# Patient Record
Sex: Male | Born: 1980 | Race: White | Hispanic: No | Marital: Married | Smoking: Never smoker
Health system: Southern US, Community
[De-identification: ages and names within clinical notes are randomized; demographics above are authoritative.]

## PROBLEM LIST (undated history)

## (undated) DIAGNOSIS — R269 Unspecified abnormalities of gait and mobility: Secondary | ICD-10-CM

## (undated) DIAGNOSIS — R569 Unspecified convulsions: Secondary | ICD-10-CM

## (undated) DIAGNOSIS — F209 Schizophrenia, unspecified: Secondary | ICD-10-CM

## (undated) DIAGNOSIS — G40909 Epilepsy, unspecified, not intractable, without status epilepticus: Secondary | ICD-10-CM

## (undated) HISTORY — DX: Unspecified abnormalities of gait and mobility: R26.9

---

## 2007-09-11 ENCOUNTER — Emergency Department: Payer: Self-pay | Admitting: Emergency Medicine

## 2007-09-22 ENCOUNTER — Emergency Department: Payer: Self-pay

## 2014-12-15 ENCOUNTER — Emergency Department: Payer: Self-pay | Admitting: Emergency Medicine

## 2015-06-20 ENCOUNTER — Telehealth: Payer: Self-pay | Admitting: Unknown Physician Specialty

## 2015-06-20 NOTE — Telephone Encounter (Signed)
I contacted billing, they stated the patient has Family Planning Medicaid only so Medicaid would not pay for the visit in question. I attempted to contact the patient's wife Lynnea Ferrier she did not answer and the name that came on the voicemail was not the patient's wife's name. Thanks.

## 2015-06-20 NOTE — Telephone Encounter (Signed)
Pt called concerning a bill he received from [place] regarding his office visit on 02/16/15. Stated they had received 3 bills that is now going to collections. Pt has Medicaid and stated he gave [place] his Medicaid card on the date of service. Patient requests call back to correct this, please call pt's wife Lynnea Ferrier @ [phone]. Thanks.

## 2016-02-09 ENCOUNTER — Encounter: Payer: Self-pay | Admitting: Emergency Medicine

## 2016-02-09 ENCOUNTER — Emergency Department
Admission: EM | Admit: 2016-02-09 | Discharge: 2016-02-09 | Disposition: A | Discharge status: Home / Self Care | Payer: Self-pay | Attending: Emergency Medicine | Admitting: Emergency Medicine

## 2016-02-09 DIAGNOSIS — G40909 Epilepsy, unspecified, not intractable, without status epilepticus: Secondary | ICD-10-CM

## 2016-02-09 DIAGNOSIS — F4325 Adjustment disorder with mixed disturbance of emotions and conduct: Secondary | ICD-10-CM

## 2016-02-09 DIAGNOSIS — F209 Schizophrenia, unspecified: Secondary | ICD-10-CM | POA: Insufficient documentation

## 2016-02-09 DIAGNOSIS — F4322 Adjustment disorder with anxiety: Secondary | ICD-10-CM | POA: Insufficient documentation

## 2016-02-09 HISTORY — DX: Epilepsy, unspecified, not intractable, without status epilepticus: G40.909

## 2016-02-09 HISTORY — DX: Schizophrenia, unspecified: F20.9

## 2016-02-09 LAB — CBC
HCT: 48.4 % (ref 40.0–52.0)
Hemoglobin: 16 g/dL (ref 13.0–18.0)
MCH: 28.6 pg (ref 26.0–34.0)
MCHC: 33.1 g/dL (ref 32.0–36.0)
MCV: 86.2 fL (ref 80.0–100.0)
PLATELETS: 203 10*3/uL (ref 150–440)
RBC: 5.61 MIL/uL (ref 4.40–5.90)
RDW: 13.4 % (ref 11.5–14.5)
WBC: 7.5 10*3/uL (ref 3.8–10.6)

## 2016-02-09 LAB — URINE DRUG SCREEN, QUALITATIVE (ARMC ONLY)
Amphetamines, Ur Screen: NOT DETECTED
BARBITURATES, UR SCREEN: NOT DETECTED
Benzodiazepine, Ur Scrn: NOT DETECTED
CANNABINOID 50 NG, UR ~~LOC~~: NOT DETECTED
COCAINE METABOLITE, UR ~~LOC~~: NOT DETECTED
MDMA (ECSTASY) UR SCREEN: NOT DETECTED
Methadone Scn, Ur: NOT DETECTED
Opiate, Ur Screen: NOT DETECTED
PHENCYCLIDINE (PCP) UR S: NOT DETECTED
TRICYCLIC, UR SCREEN: NOT DETECTED

## 2016-02-09 LAB — COMPREHENSIVE METABOLIC PANEL
ALK PHOS: 86 U/L (ref 38–126)
ALT: 40 U/L (ref 17–63)
AST: 39 U/L (ref 15–41)
Albumin: 5.1 g/dL — ABNORMAL HIGH (ref 3.5–5.0)
Anion gap: 11 (ref 5–15)
BILIRUBIN TOTAL: 1 mg/dL (ref 0.3–1.2)
BUN: 8 mg/dL (ref 6–20)
CALCIUM: 9.9 mg/dL (ref 8.9–10.3)
CHLORIDE: 105 mmol/L (ref 101–111)
CO2: 25 mmol/L (ref 22–32)
CREATININE: 0.95 mg/dL (ref 0.61–1.24)
Glucose, Bld: 103 mg/dL — ABNORMAL HIGH (ref 65–99)
Potassium: 3.6 mmol/L (ref 3.5–5.1)
Sodium: 141 mmol/L (ref 135–145)
Total Protein: 8.6 g/dL — ABNORMAL HIGH (ref 6.5–8.1)

## 2016-02-09 LAB — ETHANOL

## 2016-02-09 LAB — ACETAMINOPHEN LEVEL: Acetaminophen (Tylenol), Serum: <10 ug/mL — ABNORMAL LOW (ref 10–30)

## 2016-02-09 LAB — SALICYLATE LEVEL

## 2016-02-09 NOTE — ED Notes (Signed)
Pt presents to ED via [employer]. [employer] was called out when patient voiced thoughts of harming himself. Pt states that he thought of harming himself with a knife by stabbing himself in the chest. Pt is alert and oriented, states his wife left him this last night and he cried all night. Pt states he came willingly with the police which is who stopped him from stabbing himself in the chest. Pt denies any drinking/drug use at this time.

## 2016-02-09 NOTE — Discharge Instructions (Signed)
You have committed for safety not only of yourself but those around you. If you change your mind or you feel we are just a hurting herself or others you must return to the emergency department or seek help.

## 2016-02-09 NOTE — ED Notes (Signed)
Pt given back all belongings, states everything there he came in with. Pt awaiting for ride in waiting room at this time. Pt calm and cooperative, NAD distress noted.

## 2016-02-09 NOTE — Consult Note (Signed)
[facility] Face-to-Face Psychiatry Consult   Reason for Consult:  Consult for this 35 year old man came to the hospital under petition because he had made suicidal statements Referring Physician:  McShane Patient Identification: Timothy Potts MRN:  [id number] Principal Diagnosis: Adjustment disorder with mixed disturbance of emotions and conduct Diagnosis:   Patient Active Problem List   Diagnosis Date Noted  . Adjustment disorder with mixed disturbance of emotions and conduct [F43.25] 02/09/2016  . Epilepsy ([place]) [G18.299] 02/09/2016    Total Time spent with patient: 45 minutes  Subjective:   Timothy Potts is a 35 y.o. male patient admitted with "my wife must've had recently sent here"  HPI:  Patient says that he had called his wife last night after she left him and said that he was going to stab himself with a knife. He says that he made that statement specifically hoping that it would cause her to come back to him. Patient's wife left him last night saying that he was being immature and once a "break". He says that before this happened he was not feeling particularly depressed or down. Didn't have any particular sleep problems. He did quit his job at [employer] yesterday which may have been the last straw for his wife. Patient denies any hallucinations. He is not getting any psychiatric treatment. He did not actually do anything to hurt himself and says that he does not have any wish to do anything to hurt himself.  Medical history: Patient has epilepsy and takes Keppra. He sees a Garment/textile technologist at [facility]. No other medical problems.  Substance abuse history: Denies alcohol or drug abuse.  Social history: Patient is married. He and his wife been together about 14 months. Just the 2 of them at home. He worked in [employer]. He says he does have hobbies to kill time when he is at home.  Past Psychiatric History:  No previous psychiatric history no history of suicide attempts no history of  hospitalization. No reported history of major depression or psychotic symptoms.  Risk to Self: Is patient at risk for suicide?: Yes Risk to Others:   Prior Inpatient Therapy:   Prior Outpatient Therapy:    Past Medical History:  Past Medical History  Diagnosis Date  . Epilepsy ([place])   . Schizophrenia ([place])    History reviewed. No pertinent past surgical history. Family History: No family history on file. Family Psychiatric  History:  Patient does not know of any family history of mental health problems Social History:  History  Alcohol Use No     History  Drug Use No    Social History   Social History  . Marital Status: Married    Spouse Name: N/A  . Number of Children: N/A  . Years of Education: N/A   Social History Main Topics  . Smoking status: Never Smoker   . Smokeless tobacco: None  . Alcohol Use: No  . Drug Use: No  . Sexual Activity: Not Asked   Other Topics Concern  . None   Social History Narrative  . None   Additional Social History:    Allergies:  No Known Allergies  Labs:  Results for orders placed or performed during the hospital encounter of 02/09/16 (from the past 48 hour(s))  Comprehensive metabolic panel     Status: Abnormal   Collection Time: 02/09/16  4:25 PM  Result Value Ref Range   Sodium 141 135 - 145 mmol/L   Potassium 3.6 3.5 - 5.1 mmol/L   Chloride  105 101 - 111 mmol/L   CO2 25 22 - 32 mmol/L   Glucose, Bld 103 (H) 65 - 99 mg/dL   BUN 8 6 - 20 mg/dL   Creatinine, Ser 0.95 0.61 - 1.24 mg/dL   Calcium 9.9 8.9 - 10.3 mg/dL   Total Protein 8.6 (H) 6.5 - 8.1 g/dL   Albumin 5.1 (H) 3.5 - 5.0 g/dL   AST 39 15 - 41 U/L   ALT 40 17 - 63 U/L   Alkaline Phosphatase 86 38 - 126 U/L   Total Bilirubin 1.0 0.3 - 1.2 mg/dL   GFR calc non Af Amer >60 >60 mL/min   GFR calc Af Amer >60 >60 mL/min    Comment: (NOTE) The eGFR has been calculated using the CKD EPI equation. This calculation has not been validated in all clinical  situations. eGFR's persistently <60 mL/min signify possible Chronic Kidney Disease.    Anion gap 11 5 - 15  Ethanol (ETOH)     Status: None   Collection Time: 02/09/16  4:25 PM  Result Value Ref Range   Alcohol, Ethyl (B) <5 <5 mg/dL    Comment:        LOWEST DETECTABLE LIMIT FOR SERUM ALCOHOL IS 5 mg/dL FOR MEDICAL PURPOSES ONLY   Salicylate level     Status: None   Collection Time: 02/09/16  4:25 PM  Result Value Ref Range   Salicylate Lvl <7.3 2.8 - 30.0 mg/dL  Acetaminophen level     Status: Abnormal   Collection Time: 02/09/16  4:25 PM  Result Value Ref Range   Acetaminophen (Tylenol), Serum <10 (L) 10 - 30 ug/mL    Comment:        THERAPEUTIC CONCENTRATIONS VARY SIGNIFICANTLY. A RANGE OF 10-30 ug/mL MAY BE AN EFFECTIVE CONCENTRATION FOR MANY PATIENTS. HOWEVER, SOME ARE BEST TREATED AT CONCENTRATIONS OUTSIDE THIS RANGE. ACETAMINOPHEN CONCENTRATIONS >150 ug/mL AT 4 HOURS AFTER INGESTION AND >50 ug/mL AT 12 HOURS AFTER INGESTION ARE OFTEN ASSOCIATED WITH TOXIC REACTIONS.   CBC     Status: None   Collection Time: 02/09/16  4:25 PM  Result Value Ref Range   WBC 7.5 3.8 - 10.6 K/uL   RBC 5.61 4.40 - 5.90 MIL/uL   Hemoglobin 16.0 13.0 - 18.0 g/dL   HCT 48.4 40.0 - 52.0 %   MCV 86.2 80.0 - 100.0 fL   MCH 28.6 26.0 - 34.0 pg   MCHC 33.1 32.0 - 36.0 g/dL   RDW 13.4 11.5 - 14.5 %   Platelets 203 150 - 440 K/uL  Urine Drug Screen, Qualitative ([facility] only)     Status: None   Collection Time: 02/09/16  4:25 PM  Result Value Ref Range   Tricyclic, Ur Screen NONE DETECTED NONE DETECTED   Amphetamines, Ur Screen NONE DETECTED NONE DETECTED   MDMA (Ecstasy)Ur Screen NONE DETECTED NONE DETECTED   Cocaine Metabolite,Ur [place] NONE DETECTED NONE DETECTED   Opiate, Ur Screen NONE DETECTED NONE DETECTED   Phencyclidine (PCP) Ur S NONE DETECTED NONE DETECTED   Cannabinoid 50 Ng, Ur [place] NONE DETECTED NONE DETECTED   Barbiturates, Ur Screen NONE DETECTED NONE DETECTED    Benzodiazepine, Ur Scrn NONE DETECTED NONE DETECTED   Methadone Scn, Ur NONE DETECTED NONE DETECTED    Comment: (NOTE) 710  Tricyclics, urine               Cutoff 1000 ng/mL 200  Amphetamines, urine  Cutoff 1000 ng/mL 300  MDMA (Ecstasy), urine           Cutoff 500 ng/mL 400  Cocaine Metabolite, urine       Cutoff 300 ng/mL 500  Opiate, urine                   Cutoff 300 ng/mL 600  Phencyclidine (PCP), urine      Cutoff 25 ng/mL 700  Cannabinoid, urine              Cutoff 50 ng/mL 800  Barbiturates, urine             Cutoff 200 ng/mL 900  Benzodiazepine, urine           Cutoff 200 ng/mL 1000 Methadone, urine                Cutoff 300 ng/mL 1100 1200 The urine drug screen provides only a preliminary, unconfirmed 1300 analytical test result and should not be used for non-medical 1400 purposes. Clinical consideration and professional judgment should 1500 be applied to any positive drug screen result due to possible 1600 interfering substances. A more specific alternate chemical method 1700 must be used in order to obtain a confirmed analytical result.  1800 Gas chromato graphy / mass spectrometry (GC/MS) is the preferred 1900 confirmatory method.     No current facility-administered medications for this encounter.   No current outpatient prescriptions on file.    Musculoskeletal: Strength & Muscle Tone: within normal limits Gait & Station: normal Patient leans: N/A  Psychiatric Specialty Exam: Review of Systems  Constitutional: Negative.   HENT: Negative.   Eyes: Negative.   Respiratory: Negative.   Cardiovascular: Negative.   Gastrointestinal: Negative.   Musculoskeletal: Negative.   Skin: Negative.   Neurological: Negative.   Psychiatric/Behavioral: Negative for depression, suicidal ideas, hallucinations, memory loss and substance abuse. The patient is not nervous/anxious and does not have insomnia.     Blood pressure 143/91, pulse 100, temperature 97.9 F  (36.6 C), temperature source Oral, resp. rate 16, height 5' 5"  (1.651 m), weight 77.111 kg (170 lb), SpO2 97 %.Body mass index is 28.29 kg/(m^2).  General Appearance: Casual  Eye Contact::  Good  Speech:  Clear and Coherent  Volume:  Normal  Mood:  Euthymic  Affect:  Appropriate  Thought Process:  Logical  Orientation:  Full (Time, Place, and Person)  Thought Content:  Negative  Suicidal Thoughts:  No  Homicidal Thoughts:  No  Memory:  Immediate;   Good Recent;   Good Remote;   Good  Judgement:  Fair  Insight:  Fair  Psychomotor Activity:  Negative  Concentration:  Fair  Recall:  Ponder of Knowledge:Fair  Language: Fair  Akathisia:  No  Handed:  Right  AIMS (if indicated):     Assets:  Communication Skills Desire for Improvement Housing Resilience  ADL's:  Intact  Cognition: WNL  Sleep:      Treatment Plan Summary: Plan Patient is calm and appropriate. Not psychotic. Denies any suicidal ideation whatsoever. Patient is able to articulate an appropriate plan for self-care at home. Does not need hospitalization. Commitment discontinued. Supportive counseling done. Encouraged him to call mobile crisis if he again has suicidal thoughts or things are getting worse. Patient agrees to the plan. Case reviewed with emergency room doctor.  Disposition: Supportive therapy provided about ongoing stressors.  Alethia Berthold, MD 02/09/2016 6:01 PM

## 2016-02-09 NOTE — ED Notes (Addendum)
Pt remains in the hallway at this time. Pt alert and oriented, calm and cooperative at this time. Will continue to monitor with 15 min safety checks at this time.

## 2016-02-09 NOTE — BH Assessment (Signed)
Per [facility], patient does not meet inpatient criteria. He is to discharge home and follow-up with outpatient treatment.

## 2016-02-09 NOTE — ED Provider Notes (Addendum)
[facility] Emergency Department Provider Note  ____________________________________________   I have reviewed the triage vital signs and the nursing notes.   HISTORY  Chief Complaint Suicidal    HPI Timothy Potts is a 35 y.o. male seen here by psychiatry. Patient apparently had thoughts of harming himself  today. He states his wife left him and he was very upset. He does not drink alcohol. He was seen in via by psychiatry who feels that he is not in any imminent danger to himself. Patient himself contract for safety at this time. No other complaints.States that he was cutting food with a knife and he had "thoughts of doing something else with it to myself". Those thoughts have past.  Past Medical History  Diagnosis Date  . Epilepsy ([facility])   . Schizophrenia [facility])     Patient Active Problem List   Diagnosis Date Noted  . 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 Epilepsy ([facility]) 02/09/2016    History reviewed. No pertinent past surgical history.  No current outpatient prescriptions on file.  Allergies Review of patient's allergies indicates no known allergies.  No family history on file.  Social History Social History  Substance Use Topics  . Smoking status: Never Smoker   . Smokeless tobacco: None  . Alcohol Use: No    Review of Systems Constitutional: No fever/chills Eyes: No visual changes. ENT: No sore throat. No stiff neck no neck pain Cardiovascular: Denies chest pain. Respiratory: Denies shortness of breath. Gastrointestinal:   no vomiting.  No diarrhea.  No constipation. Genitourinary: Negative for dysuria. Musculoskeletal: Negative lower extremity swelling Skin: Negative for rash. Neurological: Negative for headaches, focal weakness or numbness. 10-point ROS otherwise negative.  ____________________________________________   PHYSICAL EXAM:  VITAL SIGNS: ED Triage Vitals  Enc Vitals  Group     BP 02/09/16 1622 143/91 mmHg     Pulse Rate 02/09/16 1622 100     Resp 02/09/16 1622 16     Temp 02/09/16 1622 97.9 F (36.6 C)     Temp Source 02/09/16 1622 Oral     SpO2 02/09/16 1622 97 %     Weight 02/09/16 1623 170 lb (77.111 kg)     Height 02/09/16 1623  (1.651 m)     Head Cir --      Peak Flow --      Pain Score 02/09/16 1623 0     Pain Loc --      Pain Edu? --      Excl. in GC? --     Constitutional: Alert and oriented. Well appearing and in no acute distress. Eyes: Conjunctivae are normal. PERRL. EOMI. Head: Atraumatic. Nose: No congestion/rhinnorhea. Mouth/Throat: Mucous membranes are moist.  Oropharynx non-erythematous. Neck: No stridor.   Nontender with no meningismus Cardiovascular: Normal rate, regular rhythm. Grossly normal heart sounds.  Good peripheral circulation. Respiratory: Normal respiratory effort.  No retractions. Lungs CTAB. Abdominal: Soft and nontender. No distention. No guarding no rebound Back:  There is no focal tenderness or step off there is no midline tenderness there are no lesions noted. there is no CVA tenderness Musculoskeletal: No lower extremity tenderness. No joint effusions, no DVT signs strong distal pulses no edema Neurologic:  Normal speech and language. No gross focal neurologic deficits are appreciated.  Skin:  Skin is warm, dry and intact. No rash noted. Psychiatric: Mood and affect are normal. Speech and behavior are normal.  ____________________________________________   LABS (all labs  ordered are listed, but only abnormal results are displayed)  Labs Reviewed  COMPREHENSIVE METABOLIC PANEL - Abnormal; Notable for the following:    Glucose, Bld 103 (*)    Total Protein 8.6 (*)    Albumin 5.1 (*)    All other components within normal limits  ACETAMINOPHEN LEVEL - Abnormal; Notable for the following:    Acetaminophen (Tylenol), Serum <10 (*)    All other components within normal limits  ETHANOL  SALICYLATE  LEVEL  CBC  URINE DRUG SCREEN, QUALITATIVE ([facility] ONLY)   ____________________________________________  EKG  I personally interpreted any EKGs ordered by me or triage  ____________________________________________  RADIOLOGY  I reviewed any imaging ordered by me or triage that were performed during my shift ____________________________________________   PROCEDURES  Procedure(s) performed: None  Critical Care performed: None  ____________________________________________   INITIAL IMPRESSION / ASSESSMENT AND PLAN / ED COURSE  Pertinent labs & imaging results that were available during my care of the patient were reviewed by me and considered in my medical decision making (see chart for details).  Patient has a history of schizophrenia and was thinking about hurting himself. He no longer is any psychotic , and he has no thoughts of hurting himself at this time. Seen and evaluated by his psychiatrist here in the emergency department he feels the patient should not be under IVC and should go home.  ----------------------------------------- 6:19 PM on 02/09/2016 -----------------------------------------  As of discussed with the patient again. He makes direct eye contact and contracts for safety has absolutely no intention of hurting himself or anyone else he states he was just overwrought but that is gone now. He has support from pastor at his church as well as his family. He is adamant that he has no urge to hurt anyone including himself or his estranged wife. He has no history of violence he states. Again he was evaluated by our psychiatrist who do not feel that there is grounds for IVC. ____________________________________________   FINAL CLINICAL IMPRESSION(S) / ED DIAGNOSES  Final diagnoses:  None      This chart was dictated using voice recognition software.  Despite best efforts to proofread,  errors can occur which can change meaning.     Jeanmarie Plant,  MD 02/09/16 1610  Jeanmarie Plant, MD 02/09/16 1820  Jeanmarie Plant, MD 02/09/16 [phone]

## 2016-02-09 NOTE — ED Notes (Signed)
Pt reports attempting to commit suicide, reports he was going to stab himself in his heart. Pt denies any HI. Pt here voluntarily.

## 2016-05-09 ENCOUNTER — Ambulatory Visit
Admission: EM | Admit: 2016-05-09 | Discharge: 2016-05-09 | Discharge status: Against Medical Advice | Payer: Self-pay | Attending: Family Medicine | Admitting: Family Medicine

## 2016-05-09 ENCOUNTER — Encounter: Payer: Self-pay | Admitting: *Deleted

## 2016-05-09 DIAGNOSIS — R569 Unspecified convulsions: Secondary | ICD-10-CM

## 2016-05-09 HISTORY — DX: Unspecified convulsions: R56.9

## 2016-05-09 LAB — GLUCOSE, CAPILLARY: Glucose-Capillary: 88 mg/dL (ref 65–99)

## 2016-05-09 NOTE — Discharge Instructions (Signed)
Epilepsy °Epilepsy is a disorder in which a person has repeated seizures over time. A seizure is a release of abnormal electrical activity in the brain. Seizures can cause a change in attention, behavior, or the ability to remain awake and alert (altered mental status). Seizures often involve uncontrollable shaking (convulsions).  °Most people with epilepsy lead normal lives. However, people with epilepsy are at an increased risk of falls, accidents, and injuries. Therefore, it is important to begin treatment right away. °CAUSES  °Epilepsy has many possible causes. Anything that disturbs the normal pattern of brain cell activity can lead to seizures. This may include:  °· Head injury. °· Birth trauma. °· High fever as a child. °· Stroke. °· Bleeding into or around the brain. °· Certain drugs. °· Prolonged low oxygen, such as what occurs after CPR efforts. °· Abnormal brain development. °· Certain illnesses, such as meningitis, encephalitis (brain infection), malaria, and other infections. °· An imbalance of nerve signaling chemicals (neurotransmitters).   °SIGNS AND SYMPTOMS  °The symptoms of a seizure can vary greatly from one person to another. Right before a seizure, you may have a warning (aura) that a seizure is about to occur. An aura may include the following symptoms: °· Fear or anxiety. °· Nausea. °· Feeling like the room is spinning (vertigo). °· Vision changes, such as seeing flashing lights or spots. °Common symptoms during a seizure include: °· Abnormal sensations, such as an abnormal smell or a bitter taste in the mouth.   °· Sudden, general body stiffness.   °· Convulsions that involve rhythmic jerking of the face, arm, or leg on one or both sides.   °· Sudden change in consciousness.   °¨ Appearing to be awake but not responding.   °¨ Appearing to be asleep but cannot be awakened.   °· Grimacing, chewing, lip smacking, drooling, tongue biting, or loss of bowel or bladder control. °After a seizure,  you may feel sleepy for a while.  °DIAGNOSIS  °Your health care provider will ask about your symptoms and take a medical history. Descriptions from any witnesses to your seizures will be very helpful in the diagnosis. A physical exam, including a detailed neurological exam, is necessary. Various tests may be done, such as:  °· An electroencephalogram (EEG). This is a painless test of your brain waves. In this test, a diagram is created of your brain waves. These diagrams can be interpreted by a specialist. °· An MRI of the brain.   °· A CT scan of the brain.   °· A spinal tap (lumbar puncture, LP). °· Blood tests to check for signs of infection or abnormal blood chemistry. °TREATMENT  °There is no cure for epilepsy, but it is generally treatable. Once epilepsy is diagnosed, it is important to begin treatment as soon as possible. For most people with epilepsy, seizures can be controlled with medicines. The following may also be used: °· A pacemaker for the brain (vagus nerve stimulator) can be used for people with seizures that are not well controlled by medicine. °· Surgery on the brain. °For some people, epilepsy eventually goes away. °HOME CARE INSTRUCTIONS  °· Follow your health care provider's recommendations on driving and safety in normal activities. °· Get enough rest. Lack of sleep can cause seizures. °· Only take over-the-counter or prescription medicines as directed by your health care provider. Take any prescribed medicine exactly as directed. °· Avoid any known triggers of your seizures. °· Keep a seizure diary. Record what you recall about any seizure, especially any possible trigger.   °· Make   sure the people you live and work with know that you are prone to seizures. They should receive instructions on how to help you. In general, a witness to a seizure should:   °¨ Cushion your head and body.   °¨ Turn you on your side.   °¨ Avoid unnecessarily restraining you.   °¨ Not place anything inside your  mouth.   °¨ Call for emergency medical help if there is any question about what has occurred.   °· Follow up with your health care provider as directed. You may need regular blood tests to monitor the levels of your medicine.   °SEEK MEDICAL CARE IF:  °· You develop signs of infection or other illness. This might increase the risk of a seizure.   °· You seem to be having more frequent seizures.   °· Your seizure pattern is changing.   °SEEK IMMEDIATE MEDICAL CARE IF:  °· You have a seizure that does not stop after a few moments.   °· You have a seizure that causes any difficulty in breathing.   °· You have a seizure that results in a very severe headache.   °· You have a seizure that leaves you with the inability to speak or use a part of your body.   °  °This information is not intended to replace advice given to you by your health care provider. Make sure you discuss any questions you have with your health care provider. °  °Document Released: 12/17/2005 Document Revised: 10/07/2013 Document Reviewed: 07/29/2013 °Elsevier Interactive Patient Education ©2016 [employer] ° ° °Seizure, Adult °A seizure is abnormal electrical activity in the brain. Seizures usually last from 30 seconds to 2 minutes. There are various types of seizures. °Before a seizure, you may have a warning sensation (aura) that a seizure is about to occur. An aura may include the following symptoms:  °· Fear or anxiety. °· Nausea. °· Feeling like the room is spinning (vertigo). °· Vision changes, such as seeing flashing lights or spots. °Common symptoms during a seizure include: °· A change in attention or behavior (altered mental status). °· Convulsions with rhythmic jerking movements. °· Drooling. °· Rapid eye movements. °· Grunting. °· Loss of bladder and bowel control. °· Bitter taste in the mouth. °· Tongue biting. °After a seizure, you may feel confused and sleepy. You may also have an injury resulting from convulsions during the  seizure. °HOME CARE INSTRUCTIONS  °· If you are given medicines, take them exactly as prescribed by your health care provider. °· Keep all follow-up appointments as directed by your health care provider. °· Do not swim or drive or engage in risky activity during which a seizure could cause further injury to you or others until your health care provider says it is OK. °· Get adequate rest. °· Teach friends and family what to do if you have a seizure. They should: °¨ Lay you on the ground to prevent a fall. °¨ Put a cushion under your head. °¨ Loosen any tight clothing around your neck. °¨ Turn you on your side. If vomiting occurs, this helps keep your airway clear. °¨ Stay with you until you recover. °¨ Know whether or not you need emergency care. °SEEK IMMEDIATE MEDICAL CARE IF: °· The seizure lasts longer than 5 minutes. °· The seizure is severe or you do not wake up immediately after the seizure. °· You have an altered mental status after the seizure. °· You are having more frequent or worsening seizures. °Someone should drive you to the emergency department or call local emergency   services (911 in [place] °MAKE SURE YOU: °· Understand these instructions. °· Will watch your condition. °· Will get help right away if you are not doing well or get worse. °  °This information is not intended to replace advice given to you by your health care provider. Make sure you discuss any questions you have with your health care provider. °  °Document Released: 12/14/2000 Document Revised: 01/07/2015 Document Reviewed: 07/29/2013 °Elsevier Interactive Patient Education ©2016 Elsevier Inc. ° °

## 2016-05-09 NOTE — ED Provider Notes (Signed)
CSN: [id number]     Arrival date & time 05/09/16  1620 History   First MD Initiated Contact with Patient 05/09/16 1643     Chief Complaint  Patient presents with  . Seizures   (Consider location/radiation/quality/duration/timing/severity/associated sxs/prior Treatment) HPI Comments: Married caucasian male with known history epilepsy, schizophrenia had seizure [place] [address] with daughter driving approximately 10 minutes per arrival.  LOC less than 1 minute per daughter.  Spouse met patient at urgent care left work.  Daughter reported patient was snoring/gasping at end of seizure.  Spouse reported patient has been having staring spells over the past month approximately 4 episodes she is aware of.  Patient reported he has only been taking 1072m of Keppra BID versus prescribed 15062mpo BID due to cost of medication.  [facility] and only has 4 pills left (50048m  Spouse and patient had not contacted neurologist or PCM regarding staring/abscense spells.  Patient reported he bit tongue during seizure and currently sore and is feeling hot.  Patient denied recent illness.  Works at [employer] all positions as needed.  Needs work excuse for today and would like me to call his manager to verify he was at urgent care s/p seizure today.  Daughter reported patient was confused after waking up from seizure in car--[place] [facility]  Patient reported he has forehead headache--typical after a seizure for him.  Denied vomiting.  Not taking lamictal at all.  [facility].  Denied recent illness/trauma/illicit drug use.  The history is provided by the patient, the spouse and a relative.    Past Medical History  Diagnosis Date  . [place] ([place] . [place]ia ([place] . [place] ([place]  History reviewed. No pertinent past  surgical history. History reviewed. No pertinent family history. Social History  Substance Use Topics  . Smoking status: Never Smoker   . Smokeless tobacco: None  . Alcohol Use: No    Review of Systems  Constitutional: Positive for diaphoresis. Negative for fever, chills, activity change, appetite change, fatigue and unexpected weight change.  HENT: Negative for congestion, dental problem, drooling, ear discharge, ear pain, facial swelling, hearing loss, mouth sores, nosebleeds, postnasal drip, rhinorrhea, sinus pressure, sneezing, sore throat, tinnitus, trouble swallowing and voice change.   Eyes: Negative for photophobia, pain, discharge, redness, itching and visual disturbance.  Respiratory: Negative for cough, choking, chest tightness, shortness of breath, wheezing and stridor.   Cardiovascular: Negative for chest pain, palpitations and leg swelling.  Gastrointestinal: Negative for nausea, vomiting, abdominal pain, diarrhea, constipation, blood in stool and abdominal distention.  Endocrine: Negative for cold intolerance and heat intolerance.  Genitourinary: Negative for dysuria and difficulty urinating.  Musculoskeletal: Negative for myalgias, back pain, joint swelling, arthralgias, gait problem, neck pain and neck stiffness.  Skin: Negative for color change, pallor, rash and wound.  Allergic/Immunologic: Positive for environmental allergies. Negative for food allergies and immunocompromised state.  Neurological: Positive for seizures and headaches. Negative for dizziness, tremors, syncope, facial asymmetry, speech difficulty, weakness, light-headedness and numbness.  Hematological: Negative for adenopathy. Does not bruise/bleed easily.  Psychiatric/Behavioral: Negative for suicidal ideas, hallucinations, behavioral problems, confusion, sleep disturbance, self-injury and agitation. The patient is not nervous/anxious and is not hyperactive.     Allergies  Review of patient's allergies  indicates no known  allergies.  Home Medications   Prior to Admission medications   Medication Sig Start Date End Date Taking? Authorizing Provider  lamoTRIgine (LAMICTAL) 100 MG tablet Take 100 mg by mouth daily.   Yes Historical Provider, MD  levETIRAcetam (KEPPRA) 500 MG tablet Take 500 mg by mouth 2 (two) times daily.   Yes Historical Provider, MD   Meds Ordered and Administered this Visit  Medications - No data to display  Pulse 134  Temp(Src) 98.1 F (36.7 C) (Oral)  Resp 20  Ht 5' 5"  (1.651 m)  Wt 179 lb (81.194 kg)  BMI 29.79 kg/m2  SpO2 97% No data found.   Physical Exam  Constitutional: He is oriented to person, place, and time. Vital signs are normal. He appears well-developed and well-nourished. He is active and cooperative.  Non-toxic appearance. He does not have a sickly appearance. He does not appear ill. No distress.  HENT:  Head: Normocephalic and atraumatic.  Right Ear: Hearing, external ear and ear canal normal. A middle ear effusion is present.  Left Ear: Hearing, external ear and ear canal normal. A middle ear effusion is present.  Nose: No mucosal edema, rhinorrhea, nose lacerations, sinus tenderness, nasal deformity, septal deviation or nasal septal hematoma. No epistaxis.  No foreign bodies. Right sinus exhibits no maxillary sinus tenderness and no frontal sinus tenderness. Left sinus exhibits no maxillary sinus tenderness and no frontal sinus tenderness.  Mouth/Throat: Uvula is midline and mucous membranes are normal. Mucous membranes are not pale, not dry and not cyanotic. He does not have dentures. No oral lesions. No trismus in the jaw. Normal dentition. No dental abscesses, uvula swelling, lacerations or dental caries. Posterior oropharyngeal edema and posterior oropharyngeal erythema present. No oropharyngeal exudate or tonsillar abscesses.    Cobblestoning posterior pharynx; bilateral TMs with air fluid level clear; teeth marks/lateral bruising  bilaterally tongue anterior no bleeding noted  Eyes: Conjunctivae, EOM and lids are normal. Pupils are equal, round, and reactive to light. Right eye exhibits no chemosis, no discharge, no exudate and no hordeolum. No foreign body present in the right eye. Left eye exhibits no chemosis, no discharge, no exudate and no hordeolum. No foreign body present in the left eye. Right conjunctiva is not injected. Right conjunctiva has no hemorrhage. Left conjunctiva is not injected. Left conjunctiva has no hemorrhage. No scleral icterus. Right eye exhibits normal extraocular motion and no nystagmus. Left eye exhibits normal extraocular motion and no nystagmus. Right pupil is round and reactive. Left pupil is round and reactive. Pupils are equal.  Wearing glasses  Neck: Trachea normal and normal range of motion. Neck supple. No tracheal tenderness, no spinous process tenderness and no muscular tenderness present. No rigidity. No tracheal deviation, no edema, no erythema and normal range of motion present. No thyroid mass and no thyromegaly present.  Cardiovascular: Normal rate, regular rhythm, S1 normal, S2 normal, normal heart sounds and intact distal pulses.  PMI is not displaced.  Exam reveals no gallop and no friction rub.   No murmur heard. Pulses:      Radial pulses are 2+ on the right side, and 2+ on the left side.  Pulmonary/Chest: Effort normal and breath sounds normal. No accessory muscle usage or stridor. No respiratory distress. He has no decreased breath sounds. He has no wheezes. He has no rhonchi. He has no rales.  Abdominal: Soft. Normal appearance and bowel sounds are normal. He exhibits no distension. There is no tenderness.  Musculoskeletal: Normal range of motion. He exhibits  no edema or tenderness.       Right shoulder: Normal.       Left shoulder: Normal.       Right elbow: Normal.      Left elbow: Normal.       Right hip: Normal.       Left hip: Normal.       Right knee: Normal.        Left knee: Normal.       Right ankle: Normal.       Left ankle: Normal.       Cervical back: Normal.       Right hand: Normal.       Left hand: Normal.  Lymphadenopathy:       Head (right side): No submental, no submandibular, no tonsillar, no preauricular, no posterior auricular and no occipital adenopathy present.       Head (left side): No submental, no submandibular, no tonsillar, no preauricular, no posterior auricular and no occipital adenopathy present.    He has no cervical adenopathy.       Right cervical: No superficial cervical, no deep cervical and no posterior cervical adenopathy present.      Left cervical: No superficial cervical, no deep cervical and no posterior cervical adenopathy present.  Neurological: He is alert and oriented to person, place, and time. He is not disoriented. He displays no atrophy, no tremor and normal reflexes. No cranial nerve deficit or sensory deficit. He exhibits normal muscle tone. He displays no seizure activity. Coordination and gait normal. GCS eye subscore is 4. GCS verbal subscore is 5. GCS motor subscore is 6.  Reflex Scores:      Patellar reflexes are 3+ on the right side and 3+ on the left side. Patient in wheelchair from car/parking lot to exam room; answered all questions appropriately A&Ox3 speech clear strength equal bilaterally 5/5 extremities gross neuro exam WNL  Skin: Skin is warm, dry and intact. No abrasion, no bruising, no burn, no ecchymosis, no laceration, no lesion, no petechiae and no rash noted. He is not diaphoretic. No cyanosis or erythema. No pallor. Nails show no clubbing.  Psychiatric: He has a normal mood and affect. His speech is normal and behavior is normal. Judgment and thought content normal. Cognition and memory are normal.  Nursing note and vitals reviewed.   ED Course  Procedures (including critical care time)  Labs Review Labs Reviewed  GLUCOSE, CAPILLARY  CBG MONITORING, ED    Imaging Review No results  found.  AMA refused EMS transport to ER or go to ER via POV Discussed risks and benefits at 1715.  Fingerstick glucose normal, VSS gross neuro check WNL at 1715  BP 110/76 HR 114 Daughter and spouse in exam room with patient.  Daughter discussed with mother and patient she preferred if patient via ambulance to ER.  Patient refused and spouse agreed.  Had patient call neurologist and [facility] pharmacy left voicemail for follow up tomorrow on re-evaluations and his medications.  Discussed with all if any seizures tonight recommended calling EMS and patient going to hospital as our facility unable to check any drug levels tonight ship out only.  We do not carry IV Keppra or po seizure medications.  Recommended keppra drug level and drug screen along with glucose fingerstick today.  Patient only allowed glucose fingerstick 88 performed by RN Myles Lipps.  Patient tolerated two glasses of ice water and took tylenol 1044m po from daughter OTC bottle acetaminophen for headache  that started after seizure "his usual".  Case discussed with Dr Posey Pronto recommended EMS transfer to hospital for further evaluation and monitoring.  Patient and family refused transfer and requested discharge to home instead.  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  Chris Pharmacist, community at [employer] [facility] [address] contacted via telephone, notified patient s/p seizure and did not recommend patient working until cleared by PCM/neurology.  Chris Oncologist) verbalized understanding of information and had no further questions at this time  Patient tolerated two glasses of water without difficulty, speech clear, A&Ox3 on discharge AMA.  Discussed patient to take neurologist prescribed pm dose of Keppra 1518m and that this is  the highest recommended dose BID.  Discussed even with taking this dose patient medication level could be still sub-therapeutic and further seizures possible. Avoid driving, Avoid alcohol, avoid street drugs or other medications unless discussed with his doctors (PCM/Neurology) Reiterated with family members and patient if further seizures recommended patient go via EMS to hospital of choice for re-evaluation.  Patient and family verbalized understanding of information/instructions and had no further questions at this time.  1[place]paperwork completed with patient and family see scan in chart by RN SMyles Lipps   MDM   1. Seizure ([place]    Noncompliant with keppra 15040mpo BID dosing due to cost of medications per patient and spouse.  [facility] and has not followed up with pharmacy POC or discussed running low on medications with PCM/Neurology to see if further drug assistance available.  Discussed patient to take neurologist prescribed pm dose of Keppra 150029mpon arrival home tonight and that this is the highest recommended dose BID per [facility].  Discussed even with taking this dose patient medication level could be still sub-therapeutic and further seizures possible. Avoid driving, Avoid alcohol, avoid street drugs or other medications unless discussed with his doctors (PCM/Neurology) Reiterated with family members and patient if further seizures tonight recommended patient go via EMS to hospital of choice for re-evaluation as he may need IV medications which we do not carry at urgent care.  Discussed in the future recommended he go to medical center [facility] as neurology on call provider on site/IV medications available.  Discussed risks of utilizing POV no medical equipment/staff to perform CPR/keep airway open/administer oxygen if needed/start IV/give medications.  Patient and family verbalized understanding  of information/instructions and had no further questions at this time.     TinOlen CordialP 05/09/16 2236
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
## 2017-08-28 ENCOUNTER — Inpatient Hospital Stay (HOSPITAL_COMMUNITY): Payer: Medicaid Other

## 2017-08-28 ENCOUNTER — Emergency Department (HOSPITAL_COMMUNITY): Payer: Medicaid Other

## 2017-08-28 ENCOUNTER — Inpatient Hospital Stay (HOSPITAL_COMMUNITY)
Admission: EM | Admit: 2017-08-28 | Discharge: 2017-09-26 | DRG: 082 | Disposition: A | Discharge status: Rehabilitation Facility | Payer: Medicaid Other | Attending: General Surgery | Admitting: General Surgery

## 2017-08-28 ENCOUNTER — Other Ambulatory Visit: Payer: Self-pay

## 2017-08-28 ENCOUNTER — Encounter (HOSPITAL_COMMUNITY): Payer: Self-pay

## 2017-08-28 DIAGNOSIS — B965 Pseudomonas (aeruginosa) (mallei) (pseudomallei) as the cause of diseases classified elsewhere: Secondary | ICD-10-CM | POA: Diagnosis not present

## 2017-08-28 DIAGNOSIS — S0240CA Maxillary fracture, right side, initial encounter for closed fracture: Secondary | ICD-10-CM | POA: Diagnosis present

## 2017-08-28 DIAGNOSIS — Y95 Nosocomial condition: Secondary | ICD-10-CM | POA: Diagnosis not present

## 2017-08-28 DIAGNOSIS — S2241XA Multiple fractures of ribs, right side, initial encounter for closed fracture: Secondary | ICD-10-CM

## 2017-08-28 DIAGNOSIS — E119 Type 2 diabetes mellitus without complications: Secondary | ICD-10-CM | POA: Diagnosis present

## 2017-08-28 DIAGNOSIS — S022XXA Fracture of nasal bones, initial encounter for closed fracture: Secondary | ICD-10-CM | POA: Diagnosis present

## 2017-08-28 DIAGNOSIS — N390 Urinary tract infection, site not specified: Secondary | ICD-10-CM | POA: Diagnosis not present

## 2017-08-28 DIAGNOSIS — J9601 Acute respiratory failure with hypoxia: Secondary | ICD-10-CM | POA: Diagnosis present

## 2017-08-28 DIAGNOSIS — Z01818 Encounter for other preprocedural examination: Secondary | ICD-10-CM

## 2017-08-28 DIAGNOSIS — S062X9A Diffuse traumatic brain injury with loss of consciousness of unspecified duration, initial encounter: Secondary | ICD-10-CM | POA: Diagnosis present

## 2017-08-28 DIAGNOSIS — Z8249 Family history of ischemic heart disease and other diseases of the circulatory system: Secondary | ICD-10-CM | POA: Diagnosis not present

## 2017-08-28 DIAGNOSIS — M25569 Pain in unspecified knee: Secondary | ICD-10-CM

## 2017-08-28 DIAGNOSIS — Z9114 Patient's other noncompliance with medication regimen: Secondary | ICD-10-CM

## 2017-08-28 DIAGNOSIS — F209 Schizophrenia, unspecified: Secondary | ICD-10-CM | POA: Diagnosis present

## 2017-08-28 DIAGNOSIS — T1490XA Injury, unspecified, initial encounter: Secondary | ICD-10-CM | POA: Diagnosis present

## 2017-08-28 DIAGNOSIS — S069X9A Unspecified intracranial injury with loss of consciousness of unspecified duration, initial encounter: Secondary | ICD-10-CM | POA: Diagnosis present

## 2017-08-28 DIAGNOSIS — R339 Retention of urine, unspecified: Secondary | ICD-10-CM | POA: Diagnosis not present

## 2017-08-28 DIAGNOSIS — J189 Pneumonia, unspecified organism: Secondary | ICD-10-CM | POA: Diagnosis not present

## 2017-08-28 DIAGNOSIS — R402212 Coma scale, best verbal response, none, at arrival to emergency department: Secondary | ICD-10-CM | POA: Diagnosis present

## 2017-08-28 DIAGNOSIS — S069XAA Unspecified intracranial injury with loss of consciousness status unknown, initial encounter: Secondary | ICD-10-CM | POA: Diagnosis present

## 2017-08-28 DIAGNOSIS — G40219 Localization-related (focal) (partial) symptomatic epilepsy and epileptic syndromes with complex partial seizures, intractable, without status epilepticus: Secondary | ICD-10-CM

## 2017-08-28 DIAGNOSIS — D62 Acute posthemorrhagic anemia: Secondary | ICD-10-CM | POA: Diagnosis present

## 2017-08-28 DIAGNOSIS — J9602 Acute respiratory failure with hypercapnia: Secondary | ICD-10-CM | POA: Diagnosis present

## 2017-08-28 DIAGNOSIS — J9690 Respiratory failure, unspecified, unspecified whether with hypoxia or hypercapnia: Secondary | ICD-10-CM

## 2017-08-28 DIAGNOSIS — R402322 Coma scale, best motor response, extension, at arrival to emergency department: Secondary | ICD-10-CM | POA: Diagnosis present

## 2017-08-28 DIAGNOSIS — R402112 Coma scale, eyes open, never, at arrival to emergency department: Secondary | ICD-10-CM | POA: Diagnosis present

## 2017-08-28 DIAGNOSIS — Z9119 Patient's noncompliance with other medical treatment and regimen: Secondary | ICD-10-CM

## 2017-08-28 DIAGNOSIS — G40909 Epilepsy, unspecified, not intractable, without status epilepticus: Secondary | ICD-10-CM | POA: Diagnosis present

## 2017-08-28 DIAGNOSIS — S02401A Maxillary fracture, unspecified, initial encounter for closed fracture: Secondary | ICD-10-CM

## 2017-08-28 DIAGNOSIS — Z4659 Encounter for fitting and adjustment of other gastrointestinal appliance and device: Secondary | ICD-10-CM

## 2017-08-28 DIAGNOSIS — Z9911 Dependence on respirator [ventilator] status: Secondary | ICD-10-CM

## 2017-08-28 LAB — URINALYSIS, ROUTINE W REFLEX MICROSCOPIC
BACTERIA UA: NONE SEEN
BILIRUBIN URINE: NEGATIVE
Glucose, UA: NEGATIVE mg/dL
Ketones, ur: NEGATIVE mg/dL
Leukocytes, UA: NEGATIVE
Nitrite: NEGATIVE
PROTEIN: NEGATIVE mg/dL
SPECIFIC GRAVITY, URINE: 1.042 — AB (ref 1.005–1.030)
SQUAMOUS EPITHELIAL / LPF: NONE SEEN
pH: 6 (ref 5.0–8.0)

## 2017-08-28 LAB — BPAM RBC
BLOOD PRODUCT EXPIRATION DATE: 201809142359
Blood Product Expiration Date: 201809042359
ISSUE DATE / TIME: 201808290855
ISSUE DATE / TIME: 201808290855
UNIT TYPE AND RH: 9500
Unit Type and Rh: 9500

## 2017-08-28 LAB — CBC
HCT: 45 % (ref 39.0–52.0)
HCT: 45.1 % (ref 39.0–52.0)
Hemoglobin: 15.4 g/dL (ref 13.0–17.0)
Hemoglobin: 15 g/dL (ref 13.0–17.0)
MCH: 29.4 pg (ref 26.0–34.0)
MCH: 28.9 pg (ref 26.0–34.0)
MCHC: 34.2 g/dL (ref 30.0–36.0)
MCHC: 33.3 g/dL (ref 30.0–36.0)
MCV: 86 fL (ref 78.0–100.0)
MCV: 86.9 fL (ref 78.0–100.0)
PLATELETS: 253 10*3/uL (ref 150–400)
PLATELETS: 184 10*3/uL (ref 150–400)
RBC: 5.23 MIL/uL (ref 4.22–5.81)
RBC: 5.19 MIL/uL (ref 4.22–5.81)
RDW: 13 % (ref 11.5–15.5)
RDW: 13.1 % (ref 11.5–15.5)
WBC: 10.2 10*3/uL (ref 4.0–10.5)
WBC: 15.1 10*3/uL — AB (ref 4.0–10.5)

## 2017-08-28 LAB — COMPREHENSIVE METABOLIC PANEL
ALBUMIN: 4.4 g/dL (ref 3.5–5.0)
ALT: 23 U/L (ref 17–63)
ALT: 26 U/L (ref 17–63)
AST: 37 U/L (ref 15–41)
AST: 50 U/L — AB (ref 15–41)
Albumin: 4.2 g/dL (ref 3.5–5.0)
Alkaline Phosphatase: 76 U/L (ref 38–126)
Alkaline Phosphatase: 68 U/L (ref 38–126)
Anion gap: 10 (ref 5–15)
Anion gap: 9 (ref 5–15)
BUN: 8 mg/dL (ref 6–20)
BUN: 8 mg/dL (ref 6–20)
CHLORIDE: 105 mmol/L (ref 101–111)
CHLORIDE: 105 mmol/L (ref 101–111)
CO2: 21 mmol/L — AB (ref 22–32)
CO2: 23 mmol/L (ref 22–32)
CREATININE: 1.19 mg/dL (ref 0.61–1.24)
Calcium: 9.3 mg/dL (ref 8.9–10.3)
Calcium: 9 mg/dL (ref 8.9–10.3)
Creatinine, Ser: 1.08 mg/dL (ref 0.61–1.24)
GFR calc Af Amer: >60 mL/min (ref >60)
GFR calc Af Amer: >60 mL/min (ref >60)
GFR calc non Af Amer: >60 mL/min (ref >60)
GLUCOSE: 124 mg/dL — AB (ref 65–99)
Glucose, Bld: 92 mg/dL (ref 65–99)
POTASSIUM: 3.9 mmol/L (ref 3.5–5.1)
Potassium: 3.6 mmol/L (ref 3.5–5.1)
SODIUM: 136 mmol/L (ref 135–145)
Sodium: 137 mmol/L (ref 135–145)
Total Bilirubin: 0.8 mg/dL (ref 0.3–1.2)
Total Bilirubin: 1.3 mg/dL — ABNORMAL HIGH (ref 0.3–1.2)
Total Protein: 7.4 g/dL (ref 6.5–8.1)
Total Protein: 7.1 g/dL (ref 6.5–8.1)

## 2017-08-28 LAB — TYPE AND SCREEN
ABO/RH(D): AB POS
ANTIBODY SCREEN: NEGATIVE
Unit division: 0
Unit division: 0

## 2017-08-28 LAB — GLUCOSE, CAPILLARY
GLUCOSE-CAPILLARY: 74 mg/dL (ref 65–99)
GLUCOSE-CAPILLARY: 90 mg/dL (ref 65–99)
Glucose-Capillary: 91 mg/dL (ref 65–99)
Glucose-Capillary: 89 mg/dL (ref 65–99)

## 2017-08-28 LAB — I-STAT CHEM 8, ED
BUN: 8 mg/dL (ref 6–20)
CREATININE: 1 mg/dL (ref 0.61–1.24)
Calcium, Ion: 1.22 mmol/L (ref 1.15–1.40)
Chloride: 101 mmol/L (ref 101–111)
Glucose, Bld: 124 mg/dL — ABNORMAL HIGH (ref 65–99)
HEMATOCRIT: 47 % (ref 39.0–52.0)
HEMOGLOBIN: 16 g/dL (ref 13.0–17.0)
POTASSIUM: 3.6 mmol/L (ref 3.5–5.1)
SODIUM: 139 mmol/L (ref 135–145)
TCO2: 25 mmol/L (ref 22–32)

## 2017-08-28 LAB — BPAM FFP
BLOOD PRODUCT EXPIRATION DATE: 201809192359
BLOOD PRODUCT EXPIRATION DATE: 201809192359
ISSUE DATE / TIME: 201808290856
ISSUE DATE / TIME: 201808290856
UNIT TYPE AND RH: 6200
Unit Type and Rh: 6200

## 2017-08-28 LAB — ABO/RH: ABO/RH(D): AB POS

## 2017-08-28 LAB — PREPARE FRESH FROZEN PLASMA
UNIT DIVISION: 0
Unit division: 0

## 2017-08-28 LAB — I-STAT CG4 LACTIC ACID, ED: LACTIC ACID, VENOUS: 4.18 mmol/L — AB (ref 0.5–1.9)

## 2017-08-28 LAB — RAPID URINE DRUG SCREEN, HOSP PERFORMED
AMPHETAMINES: NOT DETECTED
Barbiturates: NOT DETECTED
Benzodiazepines: POSITIVE — AB
COCAINE: NOT DETECTED
OPIATES: NOT DETECTED
TETRAHYDROCANNABINOL: NOT DETECTED

## 2017-08-28 LAB — TRIGLYCERIDES: TRIGLYCERIDES: 62 mg/dL (ref <150)

## 2017-08-28 LAB — PROTIME-INR
INR: 0.97
Prothrombin Time: 12.8 seconds (ref 11.4–15.2)

## 2017-08-28 LAB — I-STAT ARTERIAL BLOOD GAS, ED
BICARBONATE: 24.6 mmol/L (ref 20.0–28.0)
O2 SAT: 100 %
PCO2 ART: 38.2 mmHg (ref 32.0–48.0)
Patient temperature: 98.6
TCO2: 26 mmol/L (ref 22–32)
pH, Arterial: 7.418 (ref 7.350–7.450)
pO2, Arterial: 347 mmHg — ABNORMAL HIGH (ref 83.0–108.0)

## 2017-08-28 LAB — ETHANOL: Alcohol, Ethyl (B): <5 mg/dL (ref <5)

## 2017-08-28 LAB — CDS SEROLOGY

## 2017-08-28 MED ORDER — VECURONIUM BROMIDE 10 MG IV SOLR
INTRAVENOUS | Status: AC | PRN
Start: 2017-08-28 — End: 2017-08-28
  Administered 2017-08-28: 10 mg via INTRAVENOUS

## 2017-08-28 MED ORDER — IOPAMIDOL (ISOVUE-300) INJECTION 61%
INTRAVENOUS | Status: AC
  Administered 2017-08-28: 100 mL
  Filled 2017-08-28: qty 100

## 2017-08-28 MED ORDER — CHLORHEXIDINE GLUCONATE 0.12% ORAL RINSE (MEDLINE KIT)
15.0000 mL | Freq: Two times a day (BID) | OROMUCOSAL | Status: DC
  Administered 2017-08-28 – 2017-09-25 (×58): 15 mL via OROMUCOSAL

## 2017-08-28 MED ORDER — PANTOPRAZOLE SODIUM 40 MG IV SOLR
40.0000 mg | Freq: Every day | INTRAVENOUS | Status: DC
  Administered 2017-08-29 – 2017-09-26 (×26): 40 mg via INTRAVENOUS
  Filled 2017-08-28 (×27): qty 40

## 2017-08-28 MED ORDER — FENTANYL 2500MCG IN NS 250ML (10MCG/ML) PREMIX INFUSION
25.0000 ug/h | INTRAVENOUS | Status: DC
  Administered 2017-08-28 – 2017-08-31 (×3): 50 ug/h via INTRAVENOUS
  Administered 2017-09-01: 75 ug/h via INTRAVENOUS
  Filled 2017-08-28 (×4): qty 250

## 2017-08-28 MED ORDER — INSULIN ASPART 100 UNIT/ML ~~LOC~~ SOLN
0.0000 [IU] | Freq: Three times a day (TID) | SUBCUTANEOUS | Status: DC
Start: 2017-08-28 — End: 2017-08-28

## 2017-08-28 MED ORDER — SUCCINYLCHOLINE CHLORIDE 20 MG/ML IJ SOLN
INTRAMUSCULAR | Status: AC | PRN
  Administered 2017-08-28: 120 mg via INTRAVENOUS

## 2017-08-28 MED ORDER — ORAL CARE MOUTH RINSE
15.0000 mL | OROMUCOSAL | Status: DC
  Administered 2017-08-28 – 2017-09-11 (×144): 15 mL via OROMUCOSAL

## 2017-08-28 MED ORDER — SODIUM CHLORIDE 0.9 % IV SOLN
1000.0000 mg | Freq: Once | INTRAVENOUS | Status: AC
  Administered 2017-08-28: 1000 mg via INTRAVENOUS
  Filled 2017-08-28: qty 10

## 2017-08-28 MED ORDER — VECURONIUM BROMIDE 10 MG IV SOLR: 10.0000 mg | Freq: Once | INTRAVENOUS | Status: DC

## 2017-08-28 MED ORDER — FENTANYL CITRATE (PF) 100 MCG/2ML IJ SOLN: 50.0000 ug | Freq: Once | INTRAMUSCULAR | Status: DC

## 2017-08-28 MED ORDER — METOPROLOL TARTRATE 5 MG/5ML IV SOLN: 5.0000 mg | Freq: Four times a day (QID) | INTRAVENOUS | Status: DC | PRN

## 2017-08-28 MED ORDER — DOCUSATE SODIUM 50 MG/5ML PO LIQD
100.0000 mg | Freq: Two times a day (BID) | ORAL | Status: DC | PRN
  Administered 2017-09-02 – 2017-09-07 (×3): 100 mg
  Filled 2017-08-28 (×3): qty 10

## 2017-08-28 MED ORDER — PROPOFOL 1000 MG/100ML IV EMUL
0.0000 ug/kg/min | INTRAVENOUS | Status: DC
  Administered 2017-08-28 (×2): 20 ug/kg/min via INTRAVENOUS
  Administered 2017-08-28 – 2017-08-29 (×2): 10 ug/kg/min via INTRAVENOUS
  Administered 2017-08-30: 15 ug/kg/min via INTRAVENOUS
  Administered 2017-08-30 – 2017-08-31 (×2): 10 ug/kg/min via INTRAVENOUS
  Filled 2017-08-28 (×6): qty 100

## 2017-08-28 MED ORDER — PANTOPRAZOLE SODIUM 40 MG PO TBEC
40.0000 mg | DELAYED_RELEASE_TABLET | Freq: Every day | ORAL | Status: DC
  Administered 2017-09-22 – 2017-09-23 (×2): 40 mg via ORAL
  Filled 2017-08-28 (×4): qty 1

## 2017-08-28 MED ORDER — FENTANYL BOLUS VIA INFUSION
50.0000 ug | INTRAVENOUS | Status: DC | PRN
  Administered 2017-09-03 – 2017-09-05 (×9): 50 ug via INTRAVENOUS
  Filled 2017-08-28: qty 50

## 2017-08-28 MED ORDER — SODIUM CHLORIDE 0.9 % IV SOLN
100.0000 mg | Freq: Two times a day (BID) | INTRAVENOUS | Status: DC
  Administered 2017-08-28 – 2017-08-29 (×2): 100 mg via INTRAVENOUS
  Filled 2017-08-28 (×4): qty 10

## 2017-08-28 MED ORDER — ACETAMINOPHEN 160 MG/5ML PO SOLN
650.0000 mg | Freq: Four times a day (QID) | ORAL | Status: DC | PRN
  Administered 2017-08-28 – 2017-09-07 (×8): 650 mg
  Filled 2017-08-28 (×8): qty 20.3

## 2017-08-28 MED ORDER — ONDANSETRON 4 MG PO TBDP
4.0000 mg | ORAL_TABLET | Freq: Four times a day (QID) | ORAL | Status: DC | PRN
  Filled 2017-08-28: qty 1

## 2017-08-28 MED ORDER — INSULIN ASPART 100 UNIT/ML ~~LOC~~ SOLN
0.0000 [IU] | SUBCUTANEOUS | Status: DC
Start: 2017-08-28 — End: 2017-09-26
  Administered 2017-08-30: 3 [IU] via SUBCUTANEOUS
  Administered 2017-08-30 (×2): 2 [IU] via SUBCUTANEOUS
  Administered 2017-08-30: 3 [IU] via SUBCUTANEOUS
  Administered 2017-08-31: 1 [IU] via SUBCUTANEOUS
  Administered 2017-08-31 – 2017-09-01 (×5): 2 [IU] via SUBCUTANEOUS
  Administered 2017-09-02: 3 [IU] via SUBCUTANEOUS
  Administered 2017-09-02 – 2017-09-03 (×5): 2 [IU] via SUBCUTANEOUS
  Administered 2017-09-03: 3 [IU] via SUBCUTANEOUS
  Administered 2017-09-03 – 2017-09-07 (×11): 2 [IU] via SUBCUTANEOUS
  Administered 2017-09-07: 1 [IU] via SUBCUTANEOUS
  Administered 2017-09-07: 2 [IU] via SUBCUTANEOUS
  Administered 2017-09-07 – 2017-09-08 (×2): 3 [IU] via SUBCUTANEOUS
  Administered 2017-09-08 – 2017-09-18 (×27): 2 [IU] via SUBCUTANEOUS
  Administered 2017-09-19: 3 [IU] via SUBCUTANEOUS
  Administered 2017-09-20 – 2017-09-26 (×6): 2 [IU] via SUBCUTANEOUS

## 2017-08-28 MED ORDER — SODIUM CHLORIDE 0.9 % IV SOLN
1500.0000 mg | Freq: Two times a day (BID) | INTRAVENOUS | Status: DC
  Administered 2017-08-28 – 2017-09-11 (×28): 1500 mg via INTRAVENOUS
  Filled 2017-08-28 (×28): qty 15

## 2017-08-28 MED ORDER — POTASSIUM CHLORIDE IN NACL 20-0.9 MEQ/L-% IV SOLN
INTRAVENOUS | Status: DC
  Administered 2017-08-28 – 2017-09-01 (×6): via INTRAVENOUS
  Administered 2017-09-01: 1000 mL via INTRAVENOUS
  Administered 2017-09-02 – 2017-09-24 (×14): via INTRAVENOUS
  Filled 2017-08-28 (×30): qty 1000

## 2017-08-28 MED ORDER — ONDANSETRON HCL 4 MG/2ML IJ SOLN: 4.0000 mg | Freq: Four times a day (QID) | INTRAMUSCULAR | Status: DC | PRN

## 2017-08-28 MED ORDER — ETOMIDATE 2 MG/ML IV SOLN
INTRAVENOUS | Status: AC | PRN
  Administered 2017-08-28: 20 mg via INTRAVENOUS
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
## 2017-08-28 NOTE — ED Notes (Signed)
Patient transported to CT 

## 2017-08-28 NOTE — Progress Notes (Signed)
[place] [facility] [facility].  Family (pt's grandmother) has been notified of patient being in hospital.  RN attempted to call phone number provided from PD; no one answered but PD stated that family would be coming to hospital.

## 2017-08-28 NOTE — Progress Notes (Signed)
Bedside EEG completed, results pending. 
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
## 2017-08-28 NOTE — Progress Notes (Signed)
Spoke with wife Lynnea FerrierKerri, she would like for patient's father, Onalee HuaDavid or grandmother, Sallye OberLouise to give consent or make any decisions that may need to be made.
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
## 2017-08-28 NOTE — Progress Notes (Signed)
Orthopedic Tech Progress Note Patient Details:  WYLEY YUNG 08/25/1981 [id number]  Patient ID: [name], male   DOB: 1981-07-03, 36 y.o.   MRN: [id number]   Nikki Dom 08/28/2017, 9:27 AM Made level 1 trauma visit
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
## 2017-08-29 ENCOUNTER — Inpatient Hospital Stay (HOSPITAL_COMMUNITY): Payer: Medicaid Other

## 2017-08-29 DIAGNOSIS — G40909 Epilepsy, unspecified, not intractable, without status epilepticus: Secondary | ICD-10-CM

## 2017-08-29 LAB — POCT I-STAT 3, ART BLOOD GAS (G3+)
Acid-base deficit: 1 mmol/L (ref 0.0–2.0)
BICARBONATE: 22.5 mmol/L (ref 20.0–28.0)
O2 SAT: 99 %
PCO2 ART: 33.1 mmHg (ref 32.0–48.0)
PO2 ART: 135 mmHg — AB (ref 83.0–108.0)
Patient temperature: 99.1
TCO2: 23 mmol/L (ref 22–32)
pH, Arterial: 7.442 (ref 7.350–7.450)

## 2017-08-29 LAB — HEPATIC FUNCTION PANEL
ALK PHOS: 60 U/L (ref 38–126)
ALT: 20 U/L (ref 17–63)
AST: 50 U/L — ABNORMAL HIGH (ref 15–41)
Albumin: 3.5 g/dL (ref 3.5–5.0)
BILIRUBIN DIRECT: 0.2 mg/dL (ref 0.1–0.5)
Indirect Bilirubin: 1 mg/dL — ABNORMAL HIGH (ref 0.3–0.9)
Total Bilirubin: 1.2 mg/dL (ref 0.3–1.2)
Total Protein: 6.4 g/dL — ABNORMAL LOW (ref 6.5–8.1)

## 2017-08-29 LAB — PHOSPHORUS
PHOSPHORUS: 2.6 mg/dL (ref 2.5–4.6)
PHOSPHORUS: 2 mg/dL — AB (ref 2.5–4.6)

## 2017-08-29 LAB — CBC
HCT: 46.2 % (ref 39.0–52.0)
Hemoglobin: 15.5 g/dL (ref 13.0–17.0)
MCH: 29.3 pg (ref 26.0–34.0)
MCHC: 33.5 g/dL (ref 30.0–36.0)
MCV: 87.3 fL (ref 78.0–100.0)
PLATELETS: 199 10*3/uL (ref 150–400)
RBC: 5.29 MIL/uL (ref 4.22–5.81)
RDW: 13.3 % (ref 11.5–15.5)
WBC: 14.3 10*3/uL — ABNORMAL HIGH (ref 4.0–10.5)

## 2017-08-29 LAB — GLUCOSE, CAPILLARY
GLUCOSE-CAPILLARY: 117 mg/dL — AB (ref 65–99)
GLUCOSE-CAPILLARY: 127 mg/dL — AB (ref 65–99)
GLUCOSE-CAPILLARY: 73 mg/dL (ref 65–99)
Glucose-Capillary: 81 mg/dL (ref 65–99)
Glucose-Capillary: 88 mg/dL (ref 65–99)
Glucose-Capillary: 111 mg/dL — ABNORMAL HIGH (ref 65–99)

## 2017-08-29 LAB — COMPREHENSIVE METABOLIC PANEL
ALT: UNDETERMINED U/L (ref 17–63)
AST: UNDETERMINED U/L (ref 15–41)
Albumin: 4 g/dL (ref 3.5–5.0)
Alkaline Phosphatase: 71 U/L (ref 38–126)
Anion gap: 10 (ref 5–15)
BUN: 6 mg/dL (ref 6–20)
CHLORIDE: 108 mmol/L (ref 101–111)
CO2: 23 mmol/L (ref 22–32)
Calcium: 9.1 mg/dL (ref 8.9–10.3)
Creatinine, Ser: 0.99 mg/dL (ref 0.61–1.24)
GFR calc Af Amer: >60 mL/min (ref >60)
Glucose, Bld: 86 mg/dL (ref 65–99)
POTASSIUM: 4 mmol/L (ref 3.5–5.1)
Sodium: 141 mmol/L (ref 135–145)
Total Bilirubin: UNDETERMINED mg/dL (ref 0.3–1.2)
Total Protein: 6.9 g/dL (ref 6.5–8.1)

## 2017-08-29 LAB — MAGNESIUM
MAGNESIUM: 1.7 mg/dL (ref 1.7–2.4)
Magnesium: 1.5 mg/dL — ABNORMAL LOW (ref 1.7–2.4)

## 2017-08-29 LAB — HIV ANTIBODY (ROUTINE TESTING W REFLEX): HIV SCREEN 4TH GENERATION: NONREACTIVE

## 2017-08-29 LAB — BLOOD PRODUCT ORDER (VERBAL) VERIFICATION

## 2017-08-29 MED ORDER — LAMOTRIGINE 25 MG PO TABS
150.0000 mg | ORAL_TABLET | Freq: Two times a day (BID) | ORAL | Status: DC
  Administered 2017-08-29 – 2017-09-13 (×31): 150 mg via ORAL
  Filled 2017-08-29 (×33): qty 2

## 2017-08-29 MED ORDER — SELENIUM 50 MCG PO TABS
200.0000 ug | ORAL_TABLET | Freq: Every day | ORAL | Status: AC
  Administered 2017-08-29 – 2017-09-04 (×7): 200 ug
  Filled 2017-08-29 (×7): qty 4

## 2017-08-29 MED ORDER — VITAMIN C 500 MG PO TABS
1000.0000 mg | ORAL_TABLET | Freq: Three times a day (TID) | ORAL | Status: AC
  Administered 2017-08-29 – 2017-09-05 (×21): 1000 mg
  Filled 2017-08-29 (×21): qty 2

## 2017-08-29 MED ORDER — VITAL HIGH PROTEIN PO LIQD: 1000.0000 mL | ORAL | Status: DC

## 2017-08-29 MED ORDER — PIVOT 1.5 CAL PO LIQD
1000.0000 mL | ORAL | Status: DC
  Administered 2017-08-29 – 2017-09-17 (×23): 1000 mL
  Filled 2017-08-29 (×34): qty 1000

## 2017-08-29 MED ORDER — PRO-STAT SUGAR FREE PO LIQD: 30.0000 mL | Freq: Two times a day (BID) | ORAL | Status: DC
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
## 2017-08-29 NOTE — Progress Notes (Signed)
Subjective: His grandmother brought his medication list, he was takign lamictal 150mg  BID  Exam: Vitals:   08/29/17 1000 08/29/17 1151  BP: 129/81 134/83  Pulse: 61 61  Resp: 18   Temp: 99.9 F (37.7 C)   SpO2: 100% 100%   Gen: In bed, NAD Resp: non-labored breathing, no acute distress Abd: soft, nt  Neuro: MS: does not open eyes or follow commands CN: PERRL, does not blink to threat.  Motor: flexion on the right, brisk withdrawal on the left.  Sensory:responds to nox stim x 4.   Pertinent Labs: Cr 0.99  Impression: 36 yo M with sz disorder and TBI.   Recommendations: 1) Lamictal 150mg  BID 2) Keppra 1500mg  BID 3) Please call if there are any further concerns. Neurology will sign off.   Ritta SlotMcNeill Erandi Lemma, MD [facility] [phone]  If 7pm- 7am, please page neurology on call as listed in AMION.
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
## 2017-08-29 NOTE — Progress Notes (Signed)
Initial Nutrition Assessment  INTERVENTION:   Pivot 1.5 @ 60 ml/hr (1440 ml/day) Provides: 2160 kcal, 135 grams protein, and 1092 ml free water  TF regimen and propofol at current rate providing 2286 total kcal/day (104 % of kcal needs)  NUTRITION DIAGNOSIS:   Increased nutrient needs related to  (TBI) as evidenced by estimated needs.  GOAL:   Patient will meet greater than or equal to 90% of their needs  MONITOR:   TF tolerance, Skin, Vent status  REASON FOR ASSESSMENT:   Consult, Ventilator Enteral/tube feeding initiation and management  ASSESSMENT:   Pt with PMH of schizophrenia, seizure d/o and [facility], [facility], R rib fxs 5-7, R orbit and max sinus fxs, and nasal fx.    Pt discussed during ICU rounds and with RN.  [place] donor services following  Patient is currently intubated on ventilator support MV: 10 L/min Temp (24hrs), Avg:99.9 F (37.7 C), Min:98.2 F (36.8 C), Max:101.5 F (38.6 C)  Propofol: 4.8 ml/hr provides: 126 kcal per day Medications reviewed and include: SSI, selenium, vitamin C IVF: NS with 20 mEq KCl/L @ 100 ml/hr Labs reviewed  Diet Order:  Diet NPO time specified  Skin:   (laceration)  Last BM:  unknown  Height:   Ht Readings from Last 1 Encounters:  08/28/17 5\' 9"  (1.753 m)    Weight:   Wt Readings from Last 1 Encounters:  08/29/17 175 lb 14.8 oz (79.8 kg)    Ideal Body Weight:  72.7 kg  BMI:  Body mass index is 25.98 kg/m.  Estimated Nutritional Needs:   Kcal:  2202  Protein:  120-140 grams  Fluid:  > 2.1 L/day  EDUCATION NEEDS:   No education needs identified at this time  Kendell BaneHeather Evely Gainey RD, LDN, CNSC [phone] Pager [phone] After Hours Pager

## 2017-08-29 NOTE — Progress Notes (Signed)
Patient transported from 4N27 to CT and back without any complications.

## 2017-08-30 LAB — CBC
HCT: 41.3 % (ref 39.0–52.0)
Hemoglobin: 13.8 g/dL (ref 13.0–17.0)
MCH: 28.9 pg (ref 26.0–34.0)
MCHC: 33.4 g/dL (ref 30.0–36.0)
MCV: 86.6 fL (ref 78.0–100.0)
PLATELETS: 184 10*3/uL (ref 150–400)
RBC: 4.77 MIL/uL (ref 4.22–5.81)
RDW: 13.4 % (ref 11.5–15.5)
WBC: 8.2 10*3/uL (ref 4.0–10.5)

## 2017-08-30 LAB — BASIC METABOLIC PANEL
ANION GAP: 8 (ref 5–15)
BUN: 8 mg/dL (ref 6–20)
CALCIUM: 9.1 mg/dL (ref 8.9–10.3)
CO2: 24 mmol/L (ref 22–32)
Chloride: 110 mmol/L (ref 101–111)
Creatinine, Ser: 0.89 mg/dL (ref 0.61–1.24)
Glucose, Bld: 145 mg/dL — ABNORMAL HIGH (ref 65–99)
Potassium: 3.5 mmol/L (ref 3.5–5.1)
SODIUM: 142 mmol/L (ref 135–145)

## 2017-08-30 LAB — GLUCOSE, CAPILLARY
GLUCOSE-CAPILLARY: 152 mg/dL — AB (ref 65–99)
GLUCOSE-CAPILLARY: 95 mg/dL (ref 65–99)
Glucose-Capillary: 156 mg/dL — ABNORMAL HIGH (ref 65–99)
Glucose-Capillary: 123 mg/dL — ABNORMAL HIGH (ref 65–99)
Glucose-Capillary: 112 mg/dL — ABNORMAL HIGH (ref 65–99)

## 2017-08-30 LAB — MAGNESIUM
Magnesium: 2.1 mg/dL (ref 1.7–2.4)
Magnesium: 2.1 mg/dL (ref 1.7–2.4)

## 2017-08-30 LAB — PHOSPHORUS
PHOSPHORUS: 2 mg/dL — AB (ref 2.5–4.6)
PHOSPHORUS: 1.2 mg/dL — AB (ref 2.5–4.6)

## 2017-08-30 MED ORDER — CLONAZEPAM 0.5 MG PO TABS
0.5000 mg | ORAL_TABLET | Freq: Two times a day (BID) | ORAL | Status: DC
  Administered 2017-08-30 – 2017-09-06 (×15): 0.5 mg
  Filled 2017-08-30 (×14): qty 1

## 2017-08-30 MED ORDER — CHLORHEXIDINE GLUCONATE CLOTH 2 % EX PADS
6.0000 | MEDICATED_PAD | Freq: Every day | CUTANEOUS | Status: DC
  Administered 2017-08-30 – 2017-09-10 (×12): 6 via TOPICAL

## 2017-08-30 MED ORDER — QUETIAPINE FUMARATE 25 MG PO TABS
50.0000 mg | ORAL_TABLET | Freq: Every day | ORAL | Status: DC
  Administered 2017-08-30 – 2017-09-05 (×7): 50 mg
  Filled 2017-08-30 (×7): qty 2

## 2017-08-30 MED ORDER — SODIUM CHLORIDE 0.9% FLUSH: 10.0000 mL | INTRAVENOUS | Status: DC | PRN

## 2017-08-30 MED ORDER — CLONAZEPAM 0.1 MG/ML ORAL SUSPENSION: 0.5000 mg | Freq: Two times a day (BID) | ORAL | Status: DC

## 2017-08-30 MED ORDER — SODIUM CHLORIDE 0.9% FLUSH
10.0000 mL | Freq: Two times a day (BID) | INTRAVENOUS | Status: DC
Start: 2017-08-30 — End: 2017-09-11
  Administered 2017-08-31: 20 mL
  Administered 2017-08-31 – 2017-09-01 (×2): 10 mL
  Administered 2017-09-01: 20 mL
  Administered 2017-09-02: 10 mL
  Administered 2017-09-03: 20 mL
  Administered 2017-09-03 – 2017-09-11 (×17): 10 mL

## 2017-08-30 NOTE — Progress Notes (Signed)
Peripherally Inserted Central Catheter/Midline Placement  The IV Nurse has discussed with the patient and/or persons authorized to consent for the patient, the purpose of this procedure and the potential benefits and risks involved with this procedure.  The benefits include less needle sticks, lab draws from the catheter, and the patient may be discharged home with the catheter. Risks include, but not limited to, infection, bleeding, blood clot (thrombus formation), and puncture of an artery; nerve damage and irregular heartbeat and possibility to perform a PICC exchange if needed/ordered by physician.  Alternatives to this procedure were also discussed.  Bard Power PICC patient education guide, fact sheet on infection prevention and patient information card has been provided to patient /or left at bedside.  Consent signed by father due to altered mental status.  PICC/Midline Placement Documentation        Timothy Potts, Timothy Potts 08/30/2017, 1:08 PM
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
## 2017-08-30 NOTE — Plan of Care (Signed)
Problem: Respiratory: Goal: Ability to maintain adequate ventilation will improve Outcome: Progressing Pt weaning on ventilator this morning.  Problem: Skin Integrity: Goal: Risk for impaired skin integrity will decrease Outcome: Progressing Turn pt q2hrs, skin kept clean and dry, adequate nutrition, changed wound dressings. Goal: Demonstration of wound healing without infection will improve Outcome: Progressing Wounds in various stages of healing, no s/s of infection.

## 2017-08-30 NOTE — Progress Notes (Deleted)
Patient self extubated himself at 2340. SpO2 dropped to 80s. RN placed patient on 100% nonrebreather. SpO2 is currently 100% and patient is protecting his airway. Breath sounds are coarse bilaterally, vitals are stable. RT will monitor as needed.

## 2017-08-31 ENCOUNTER — Inpatient Hospital Stay (HOSPITAL_COMMUNITY): Payer: Medicaid Other

## 2017-08-31 LAB — BASIC METABOLIC PANEL
ANION GAP: 8 (ref 5–15)
BUN: 7 mg/dL (ref 6–20)
CALCIUM: 8.5 mg/dL — AB (ref 8.9–10.3)
CO2: 24 mmol/L (ref 22–32)
CREATININE: 0.78 mg/dL (ref 0.61–1.24)
Chloride: 111 mmol/L (ref 101–111)
GFR calc Af Amer: >60 mL/min (ref >60)
GLUCOSE: 110 mg/dL — AB (ref 65–99)
Potassium: 3.8 mmol/L (ref 3.5–5.1)
Sodium: 143 mmol/L (ref 135–145)

## 2017-08-31 LAB — GLUCOSE, CAPILLARY
GLUCOSE-CAPILLARY: 128 mg/dL — AB (ref 65–99)
GLUCOSE-CAPILLARY: 109 mg/dL — AB (ref 65–99)
Glucose-Capillary: 107 mg/dL — ABNORMAL HIGH (ref 65–99)
Glucose-Capillary: 119 mg/dL — ABNORMAL HIGH (ref 65–99)
Glucose-Capillary: 128 mg/dL — ABNORMAL HIGH (ref 65–99)
Glucose-Capillary: 133 mg/dL — ABNORMAL HIGH (ref 65–99)

## 2017-08-31 LAB — MRSA PCR SCREENING: MRSA BY PCR: NEGATIVE

## 2017-08-31 LAB — TRIGLYCERIDES: Triglycerides: 70 mg/dL (ref <150)

## 2017-08-31 MED ORDER — WHITE PETROLATUM GEL
Status: AC
  Administered 2017-08-31: 1
  Filled 2017-08-31: qty 1

## 2017-08-31 NOTE — Progress Notes (Signed)
Patient transported from 4N27 to CT and back with no complications.

## 2017-08-31 NOTE — Progress Notes (Signed)
ETT holder changed, with assistance of another RT, without complications.
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
## 2017-09-01 LAB — CBC
HEMATOCRIT: 32.6 % — AB (ref 39.0–52.0)
Hemoglobin: 10.6 g/dL — ABNORMAL LOW (ref 13.0–17.0)
MCH: 27.7 pg (ref 26.0–34.0)
MCHC: 32.5 g/dL (ref 30.0–36.0)
MCV: 85.3 fL (ref 78.0–100.0)
Platelets: 209 10*3/uL (ref 150–400)
RBC: 3.82 MIL/uL — ABNORMAL LOW (ref 4.22–5.81)
RDW: 13.4 % (ref 11.5–15.5)
WBC: 4.6 10*3/uL (ref 4.0–10.5)

## 2017-09-01 LAB — BASIC METABOLIC PANEL
ANION GAP: 6 (ref 5–15)
BUN: 10 mg/dL (ref 6–20)
CALCIUM: 8.5 mg/dL — AB (ref 8.9–10.3)
CHLORIDE: 112 mmol/L — AB (ref 101–111)
CO2: 26 mmol/L (ref 22–32)
Creatinine, Ser: 0.67 mg/dL (ref 0.61–1.24)
GFR calc non Af Amer: >60 mL/min (ref >60)
GLUCOSE: 106 mg/dL — AB (ref 65–99)
POTASSIUM: 3.8 mmol/L (ref 3.5–5.1)
Sodium: 144 mmol/L (ref 135–145)

## 2017-09-01 LAB — GLUCOSE, CAPILLARY
GLUCOSE-CAPILLARY: 120 mg/dL — AB (ref 65–99)
GLUCOSE-CAPILLARY: 129 mg/dL — AB (ref 65–99)
GLUCOSE-CAPILLARY: 112 mg/dL — AB (ref 65–99)
GLUCOSE-CAPILLARY: 145 mg/dL — AB (ref 65–99)
Glucose-Capillary: 112 mg/dL — ABNORMAL HIGH (ref 65–99)
Glucose-Capillary: 141 mg/dL — ABNORMAL HIGH (ref 65–99)
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
## 2017-09-02 LAB — GLUCOSE, CAPILLARY
GLUCOSE-CAPILLARY: 110 mg/dL — AB (ref 65–99)
GLUCOSE-CAPILLARY: 137 mg/dL — AB (ref 65–99)
GLUCOSE-CAPILLARY: 141 mg/dL — AB (ref 65–99)
GLUCOSE-CAPILLARY: 141 mg/dL — AB (ref 65–99)
Glucose-Capillary: 115 mg/dL — ABNORMAL HIGH (ref 65–99)
Glucose-Capillary: 122 mg/dL — ABNORMAL HIGH (ref 65–99)
Glucose-Capillary: 166 mg/dL — ABNORMAL HIGH (ref 65–99)

## 2017-09-02 MED ORDER — FENTANYL 2500MCG IN NS 250ML (10MCG/ML) PREMIX INFUSION
25.0000 ug/h | INTRAVENOUS | Status: DC
  Administered 2017-09-03: 50 ug/h via INTRAVENOUS
  Administered 2017-09-04 – 2017-09-06 (×3): 100 ug/h via INTRAVENOUS
  Administered 2017-09-07: 125 ug/h via INTRAVENOUS
  Administered 2017-09-09: 25 ug/h via INTRAVENOUS
  Filled 2017-09-02 (×7): qty 250
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
## 2017-09-03 ENCOUNTER — Inpatient Hospital Stay (HOSPITAL_COMMUNITY): Payer: Medicaid Other

## 2017-09-03 ENCOUNTER — Encounter (HOSPITAL_COMMUNITY): Payer: Self-pay

## 2017-09-03 LAB — BASIC METABOLIC PANEL
ANION GAP: 8 (ref 5–15)
BUN: 15 mg/dL (ref 6–20)
CALCIUM: 8.9 mg/dL (ref 8.9–10.3)
CO2: 27 mmol/L (ref 22–32)
CREATININE: 0.69 mg/dL (ref 0.61–1.24)
Chloride: 104 mmol/L (ref 101–111)
GFR calc Af Amer: >60 mL/min (ref >60)
GLUCOSE: 135 mg/dL — AB (ref 65–99)
Potassium: 4 mmol/L (ref 3.5–5.1)
Sodium: 139 mmol/L (ref 135–145)

## 2017-09-03 LAB — CBC
HCT: 34.4 % — ABNORMAL LOW (ref 39.0–52.0)
Hemoglobin: 11 g/dL — ABNORMAL LOW (ref 13.0–17.0)
MCH: 27.9 pg (ref 26.0–34.0)
MCHC: 32 g/dL (ref 30.0–36.0)
MCV: 87.3 fL (ref 78.0–100.0)
PLATELETS: 272 10*3/uL (ref 150–400)
RBC: 3.94 MIL/uL — ABNORMAL LOW (ref 4.22–5.81)
RDW: 13.3 % (ref 11.5–15.5)
WBC: 6.7 10*3/uL (ref 4.0–10.5)

## 2017-09-03 LAB — GLUCOSE, CAPILLARY
GLUCOSE-CAPILLARY: 110 mg/dL — AB (ref 65–99)
GLUCOSE-CAPILLARY: 129 mg/dL — AB (ref 65–99)
GLUCOSE-CAPILLARY: 129 mg/dL — AB (ref 65–99)
Glucose-Capillary: 130 mg/dL — ABNORMAL HIGH (ref 65–99)
Glucose-Capillary: 125 mg/dL — ABNORMAL HIGH (ref 65–99)
Glucose-Capillary: 168 mg/dL — ABNORMAL HIGH (ref 65–99)

## 2017-09-03 MED ORDER — FENTANYL CITRATE (PF) 100 MCG/2ML IJ SOLN
INTRAMUSCULAR | Status: AC
  Filled 2017-09-03: qty 2

## 2017-09-03 MED ORDER — WHITE PETROLATUM GEL
Status: AC
  Administered 2017-09-03: 1
  Filled 2017-09-03: qty 1

## 2017-09-03 MED ORDER — MIDAZOLAM HCL 2 MG/2ML IJ SOLN
INTRAMUSCULAR | Status: AC
  Filled 2017-09-03: qty 2
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
## 2017-09-04 ENCOUNTER — Inpatient Hospital Stay (HOSPITAL_COMMUNITY): Payer: Medicaid Other | Admitting: Certified Registered"

## 2017-09-04 ENCOUNTER — Inpatient Hospital Stay (HOSPITAL_COMMUNITY): Payer: Medicaid Other

## 2017-09-04 LAB — CBC
HCT: 33.4 % — ABNORMAL LOW (ref 39.0–52.0)
HEMOGLOBIN: 10.7 g/dL — AB (ref 13.0–17.0)
MCH: 28.2 pg (ref 26.0–34.0)
MCHC: 32 g/dL (ref 30.0–36.0)
MCV: 87.9 fL (ref 78.0–100.0)
Platelets: 286 10*3/uL (ref 150–400)
RBC: 3.8 MIL/uL — ABNORMAL LOW (ref 4.22–5.81)
RDW: 13.3 % (ref 11.5–15.5)
WBC: 8.2 10*3/uL (ref 4.0–10.5)

## 2017-09-04 LAB — BASIC METABOLIC PANEL
Anion gap: 7 (ref 5–15)
BUN: 17 mg/dL (ref 6–20)
CHLORIDE: 106 mmol/L (ref 101–111)
CO2: 27 mmol/L (ref 22–32)
CREATININE: 0.7 mg/dL (ref 0.61–1.24)
Calcium: 8.6 mg/dL — ABNORMAL LOW (ref 8.9–10.3)
GFR calc Af Amer: >60 mL/min (ref >60)
GFR calc non Af Amer: >60 mL/min (ref >60)
Glucose, Bld: 159 mg/dL — ABNORMAL HIGH (ref 65–99)
Potassium: 3.9 mmol/L (ref 3.5–5.1)
SODIUM: 140 mmol/L (ref 135–145)

## 2017-09-04 LAB — POCT I-STAT 3, ART BLOOD GAS (G3+)
ACID-BASE EXCESS: 3 mmol/L — AB (ref 0.0–2.0)
BICARBONATE: 31.2 mmol/L — AB (ref 20.0–28.0)
O2 Saturation: 99 %
PH ART: 7.295 — AB (ref 7.350–7.450)
TCO2: 33 mmol/L — ABNORMAL HIGH (ref 22–32)
pCO2 arterial: 64.5 mmHg — ABNORMAL HIGH (ref 32.0–48.0)
pO2, Arterial: 148 mmHg — ABNORMAL HIGH (ref 83.0–108.0)

## 2017-09-04 LAB — GLUCOSE, CAPILLARY
GLUCOSE-CAPILLARY: 111 mg/dL — AB (ref 65–99)
GLUCOSE-CAPILLARY: 113 mg/dL — AB (ref 65–99)
GLUCOSE-CAPILLARY: 123 mg/dL — AB (ref 65–99)
GLUCOSE-CAPILLARY: 127 mg/dL — AB (ref 65–99)
GLUCOSE-CAPILLARY: 134 mg/dL — AB (ref 65–99)
Glucose-Capillary: 105 mg/dL — ABNORMAL HIGH (ref 65–99)

## 2017-09-04 MED ORDER — MIDAZOLAM HCL 2 MG/2ML IJ SOLN
2.0000 mg | INTRAMUSCULAR | Status: DC | PRN
  Administered 2017-09-04 – 2017-09-10 (×4): 2 mg via INTRAVENOUS
  Filled 2017-09-04 (×4): qty 2

## 2017-09-04 MED ORDER — ALBUTEROL SULFATE (2.5 MG/3ML) 0.083% IN NEBU
INHALATION_SOLUTION | RESPIRATORY_TRACT | Status: AC
  Administered 2017-09-04: 2.5 mg via RESPIRATORY_TRACT
  Filled 2017-09-04: qty 3

## 2017-09-04 MED ORDER — ALBUTEROL SULFATE (2.5 MG/3ML) 0.083% IN NEBU
2.5000 mg | INHALATION_SOLUTION | Freq: Once | RESPIRATORY_TRACT | Status: AC
  Administered 2017-09-04: 2.5 mg via RESPIRATORY_TRACT

## 2017-09-04 MED ORDER — GLYCOPYRROLATE 0.2 MG/ML IJ SOLN
0.2000 mg | Freq: Once | INTRAMUSCULAR | Status: AC
  Administered 2017-09-04: 0.2 mg via INTRAVENOUS
  Filled 2017-09-04: qty 1

## 2017-09-04 MED ORDER — MIDAZOLAM HCL 2 MG/2ML IJ SOLN
INTRAMUSCULAR | Status: AC
  Administered 2017-09-04: 2 mg
  Filled 2017-09-04: qty 2

## 2017-09-04 MED ORDER — PROPOFOL 10 MG/ML IV BOLUS
INTRAVENOUS | Status: DC | PRN
  Administered 2017-09-04: 150 mg via INTRAVENOUS

## 2017-09-04 MED ORDER — SUCCINYLCHOLINE CHLORIDE 20 MG/ML IJ SOLN
INTRAMUSCULAR | Status: DC | PRN
  Administered 2017-09-04: 100 mg via INTRAVENOUS
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
## 2017-09-04 NOTE — Progress Notes (Signed)
RT NTS patient at this time. Some bleeding noted, therefore, suctioning stopped. Patients mouth suctioned at this time.

## 2017-09-04 NOTE — Progress Notes (Signed)
Patient continues to struggle for with ventilation.  Intermittently drops his saturations into the 80's.  ABG shows a PaCO2 of 65 although his oxygenation is good.  I  Have asked anesthesia to reintubate this patient based on CO2 retention and his inability to handle his secretions.  Marta LamasJames O. Gae BonWyatt, III, MD, FACS [phone] Trauma Surgeon

## 2017-09-04 NOTE — Procedures (Signed)
Extubation Procedure Note  Patient Details:   Name: Timothy CoombsJeremy L Potts DOB: [birth year] MRN: [id number]   Airway Documentation:     Evaluation  O2 sats: stable throughout Complications: No apparent complications Patient did tolerate procedure well. Bilateral Breath Sounds: Rhonchi, Diminished   No   Patient did not speak after extubation. Positive cuff leak noted, no evidence of stridor, no apparent distress or complications, patient placed on 4LNC per order but due to desaturation MD said to place patient on NRB. Patient vitals are stable, patient is resting comfortably, RT will continue to monitor.   Christean Silvestri N Jamarkis Branam 09/04/2017, 6:08 PM
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
## 2017-09-04 NOTE — Progress Notes (Signed)
Paged Trauma, spoke w/ Dr. Lindie SpruceWyatt re: pt unable to clear secretions, RR 30-40/min, hr 120-130bpm. Verbal order for albuterol and NT suctioning. RT at bedside. Pt desat during albuterol tx, bleeding with NT suctioning - paged Dr. Lindie SpruceWyatt again, will come evaluate pt.

## 2017-09-04 NOTE — Anesthesia Procedure Notes (Signed)
Procedure Name: Intubation Date/Time: 09/04/2017 10:15 PM Performed by: Melina SchoolsBANKS, Sydnie Sigmund J Pre-anesthesia Checklist: Patient identified, Suction available and Patient being monitored Patient Re-evaluated:Patient Re-evaluated prior to induction Oxygen Delivery Method: Ambu bag Preoxygenation: Pre-oxygenation with 100% oxygen Induction Type: IV induction, Rapid sequence and Cricoid Pressure applied Laryngoscope Size: Glidescope and 3 Grade View: Grade II Tube type: Subglottic suction tube Tube size: 7.5 mm Number of attempts: 1 Airway Equipment and Method: Stylet and Video-laryngoscopy Placement Confirmation: CO2 detector and breath sounds checked- equal and bilateral Secured at: 24 cm Tube secured with: Tape Dental Injury: Teeth and Oropharynx as per pre-operative assessment

## 2017-09-04 NOTE — Progress Notes (Signed)
Pt has been desating and not protecting their airway on and off since extubation at 1800 today.  After getting a stat ABG, per Dr. Dixon BoosWyatt's order's we will reintubate.  Anesthesia has been called and are on their way. Will continue to monitor.

## 2017-09-05 LAB — GLUCOSE, CAPILLARY
GLUCOSE-CAPILLARY: 107 mg/dL — AB (ref 65–99)
Glucose-Capillary: 114 mg/dL — ABNORMAL HIGH (ref 65–99)
Glucose-Capillary: 133 mg/dL — ABNORMAL HIGH (ref 65–99)
Glucose-Capillary: 98 mg/dL (ref 65–99)
Glucose-Capillary: 99 mg/dL (ref 65–99)
Glucose-Capillary: 99 mg/dL (ref 65–99)

## 2017-09-05 LAB — BASIC METABOLIC PANEL
Anion gap: 7 (ref 5–15)
BUN: 15 mg/dL (ref 6–20)
CHLORIDE: 106 mmol/L (ref 101–111)
CO2: 28 mmol/L (ref 22–32)
Calcium: 8.8 mg/dL — ABNORMAL LOW (ref 8.9–10.3)
Creatinine, Ser: 0.57 mg/dL — ABNORMAL LOW (ref 0.61–1.24)
GFR calc Af Amer: >60 mL/min (ref >60)
GFR calc non Af Amer: >60 mL/min (ref >60)
Glucose, Bld: 117 mg/dL — ABNORMAL HIGH (ref 65–99)
POTASSIUM: 3.8 mmol/L (ref 3.5–5.1)
SODIUM: 141 mmol/L (ref 135–145)

## 2017-09-05 LAB — POCT I-STAT 3, ART BLOOD GAS (G3+)
Acid-Base Excess: 1 mmol/L (ref 0.0–2.0)
BICARBONATE: 27 mmol/L (ref 20.0–28.0)
O2 Saturation: 98 %
PCO2 ART: 46.6 mmHg (ref 32.0–48.0)
PO2 ART: 118 mmHg — AB (ref 83.0–108.0)
TCO2: 28 mmol/L (ref 22–32)
pH, Arterial: 7.371 (ref 7.350–7.450)

## 2017-09-05 LAB — CBC
HEMATOCRIT: 33.8 % — AB (ref 39.0–52.0)
HEMOGLOBIN: 10.8 g/dL — AB (ref 13.0–17.0)
MCH: 28.1 pg (ref 26.0–34.0)
MCHC: 32 g/dL (ref 30.0–36.0)
MCV: 87.8 fL (ref 78.0–100.0)
Platelets: 308 10*3/uL (ref 150–400)
RBC: 3.85 MIL/uL — AB (ref 4.22–5.81)
RDW: 13.4 % (ref 11.5–15.5)
WBC: 10.5 10*3/uL (ref 4.0–10.5)

## 2017-09-05 MED ORDER — ENOXAPARIN SODIUM 40 MG/0.4ML ~~LOC~~ SOLN
40.0000 mg | SUBCUTANEOUS | Status: DC
  Administered 2017-09-05 – 2017-09-22 (×18): 40 mg via SUBCUTANEOUS
  Filled 2017-09-05 (×18): qty 0.4
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
## 2017-09-06 ENCOUNTER — Inpatient Hospital Stay (HOSPITAL_COMMUNITY): Payer: Medicaid Other

## 2017-09-06 LAB — BASIC METABOLIC PANEL
Anion gap: 9 (ref 5–15)
BUN: 16 mg/dL (ref 6–20)
CALCIUM: 9 mg/dL (ref 8.9–10.3)
CO2: 27 mmol/L (ref 22–32)
CREATININE: 0.62 mg/dL (ref 0.61–1.24)
Chloride: 105 mmol/L (ref 101–111)
GFR calc Af Amer: >60 mL/min (ref >60)
GLUCOSE: 127 mg/dL — AB (ref 65–99)
POTASSIUM: 3.7 mmol/L (ref 3.5–5.1)
SODIUM: 141 mmol/L (ref 135–145)

## 2017-09-06 LAB — CBC
HEMATOCRIT: 35.1 % — AB (ref 39.0–52.0)
Hemoglobin: 11.4 g/dL — ABNORMAL LOW (ref 13.0–17.0)
MCH: 28.4 pg (ref 26.0–34.0)
MCHC: 32.5 g/dL (ref 30.0–36.0)
MCV: 87.3 fL (ref 78.0–100.0)
PLATELETS: 389 10*3/uL (ref 150–400)
RBC: 4.02 MIL/uL — ABNORMAL LOW (ref 4.22–5.81)
RDW: 13.4 % (ref 11.5–15.5)
WBC: 11.2 10*3/uL — ABNORMAL HIGH (ref 4.0–10.5)

## 2017-09-06 LAB — GLUCOSE, CAPILLARY
GLUCOSE-CAPILLARY: 107 mg/dL — AB (ref 65–99)
GLUCOSE-CAPILLARY: 134 mg/dL — AB (ref 65–99)
Glucose-Capillary: 128 mg/dL — ABNORMAL HIGH (ref 65–99)
Glucose-Capillary: 107 mg/dL — ABNORMAL HIGH (ref 65–99)
Glucose-Capillary: 109 mg/dL — ABNORMAL HIGH (ref 65–99)

## 2017-09-06 MED ORDER — FREE WATER
100.0000 mL | Freq: Three times a day (TID) | Status: DC
  Administered 2017-09-06 – 2017-09-10 (×12): 100 mL

## 2017-09-06 MED ORDER — QUETIAPINE FUMARATE 100 MG PO TABS
100.0000 mg | ORAL_TABLET | Freq: Two times a day (BID) | ORAL | Status: DC
  Administered 2017-09-06 – 2017-09-11 (×9): 100 mg
  Filled 2017-09-06 (×9): qty 1

## 2017-09-06 MED ORDER — CLONAZEPAM 1 MG PO TABS
1.0000 mg | ORAL_TABLET | Freq: Two times a day (BID) | ORAL | Status: DC
  Administered 2017-09-06 – 2017-09-11 (×9): 1 mg
  Filled 2017-09-06 (×10): qty 1

## 2017-09-06 MED ORDER — BETHANECHOL CHLORIDE 25 MG PO TABS
25.0000 mg | ORAL_TABLET | Freq: Three times a day (TID) | ORAL | Status: DC
  Administered 2017-09-06 – 2017-09-26 (×58): 25 mg
  Filled 2017-09-06: qty 3
  Filled 2017-09-06 (×9): qty 1
  Filled 2017-09-06: qty 3
  Filled 2017-09-06 (×3): qty 1
  Filled 2017-09-06 (×4): qty 3
  Filled 2017-09-06: qty 1
  Filled 2017-09-06 (×4): qty 3
  Filled 2017-09-06 (×2): qty 1
  Filled 2017-09-06: qty 3
  Filled 2017-09-06 (×5): qty 1
  Filled 2017-09-06 (×3): qty 3
  Filled 2017-09-06 (×3): qty 1
  Filled 2017-09-06: qty 3
  Filled 2017-09-06 (×2): qty 1
  Filled 2017-09-06 (×3): qty 3
  Filled 2017-09-06: qty 1
  Filled 2017-09-06: qty 3
  Filled 2017-09-06 (×2): qty 1
  Filled 2017-09-06: qty 3
  Filled 2017-09-06 (×8): qty 1
  Filled 2017-09-06 (×5): qty 3
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
## 2017-09-07 LAB — BASIC METABOLIC PANEL
ANION GAP: 10 (ref 5–15)
BUN: 19 mg/dL (ref 6–20)
CALCIUM: 9.4 mg/dL (ref 8.9–10.3)
CO2: 27 mmol/L (ref 22–32)
Chloride: 103 mmol/L (ref 101–111)
Creatinine, Ser: 0.65 mg/dL (ref 0.61–1.24)
GFR calc Af Amer: >60 mL/min (ref >60)
GFR calc non Af Amer: >60 mL/min (ref >60)
GLUCOSE: 110 mg/dL — AB (ref 65–99)
POTASSIUM: 4.1 mmol/L (ref 3.5–5.1)
Sodium: 140 mmol/L (ref 135–145)

## 2017-09-07 LAB — GLUCOSE, CAPILLARY
GLUCOSE-CAPILLARY: 144 mg/dL — AB (ref 65–99)
GLUCOSE-CAPILLARY: 138 mg/dL — AB (ref 65–99)
Glucose-Capillary: 110 mg/dL — ABNORMAL HIGH (ref 65–99)
Glucose-Capillary: 125 mg/dL — ABNORMAL HIGH (ref 65–99)
Glucose-Capillary: 128 mg/dL — ABNORMAL HIGH (ref 65–99)
Glucose-Capillary: 177 mg/dL — ABNORMAL HIGH (ref 65–99)

## 2017-09-07 MED ORDER — PIPERACILLIN-TAZOBACTAM 3.375 G IVPB
3.3750 g | Freq: Three times a day (TID) | INTRAVENOUS | Status: DC
  Administered 2017-09-07 – 2017-09-08 (×3): 3.375 g via INTRAVENOUS
  Filled 2017-09-07 (×4): qty 50

## 2017-09-07 NOTE — Progress Notes (Signed)
MD notified of increased temp of 102.1 Degrees. Ice packs placed under groin/armpits. Tylenol was given via OG tube. Orders obtained for cultures.
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
## 2017-09-08 ENCOUNTER — Inpatient Hospital Stay (HOSPITAL_COMMUNITY): Payer: Medicaid Other

## 2017-09-08 LAB — GLUCOSE, CAPILLARY
GLUCOSE-CAPILLARY: 154 mg/dL — AB (ref 65–99)
GLUCOSE-CAPILLARY: 129 mg/dL — AB (ref 65–99)
Glucose-Capillary: 133 mg/dL — ABNORMAL HIGH (ref 65–99)
Glucose-Capillary: 139 mg/dL — ABNORMAL HIGH (ref 65–99)
Glucose-Capillary: 116 mg/dL — ABNORMAL HIGH (ref 65–99)
Glucose-Capillary: 119 mg/dL — ABNORMAL HIGH (ref 65–99)

## 2017-09-08 LAB — BASIC METABOLIC PANEL
ANION GAP: 9 (ref 5–15)
BUN: 24 mg/dL — ABNORMAL HIGH (ref 6–20)
CALCIUM: 8.9 mg/dL (ref 8.9–10.3)
CO2: 28 mmol/L (ref 22–32)
CREATININE: 0.72 mg/dL (ref 0.61–1.24)
Chloride: 103 mmol/L (ref 101–111)
Glucose, Bld: 136 mg/dL — ABNORMAL HIGH (ref 65–99)
Potassium: 4.1 mmol/L (ref 3.5–5.1)
SODIUM: 140 mmol/L (ref 135–145)

## 2017-09-08 LAB — CBC
HEMATOCRIT: 37 % — AB (ref 39.0–52.0)
Hemoglobin: 11.9 g/dL — ABNORMAL LOW (ref 13.0–17.0)
MCH: 28.3 pg (ref 26.0–34.0)
MCHC: 32.2 g/dL (ref 30.0–36.0)
MCV: 87.9 fL (ref 78.0–100.0)
PLATELETS: 394 10*3/uL (ref 150–400)
RBC: 4.21 MIL/uL — ABNORMAL LOW (ref 4.22–5.81)
RDW: 13.3 % (ref 11.5–15.5)
WBC: 16.4 10*3/uL — AB (ref 4.0–10.5)

## 2017-09-08 LAB — MAGNESIUM: MAGNESIUM: 2.1 mg/dL (ref 1.7–2.4)

## 2017-09-08 MED ORDER — DEXTROSE 5 % IV SOLN
1.0000 g | Freq: Three times a day (TID) | INTRAVENOUS | Status: AC
  Administered 2017-09-08 – 2017-09-15 (×23): 1 g via INTRAVENOUS
  Filled 2017-09-08 (×24): qty 1

## 2017-09-08 MED ORDER — VANCOMYCIN HCL IN DEXTROSE 1-5 GM/200ML-% IV SOLN
1000.0000 mg | Freq: Three times a day (TID) | INTRAVENOUS | Status: DC
  Administered 2017-09-08 – 2017-09-10 (×6): 1000 mg via INTRAVENOUS
  Filled 2017-09-08 (×7): qty 200

## 2017-09-08 NOTE — Progress Notes (Addendum)
Pharmacy Antibiotic Note  Teresa CoombsJeremy L Radel is a 36 y.o. male admitted on 08/28/2017 with pneumonia.  Pharmacy has been consulted for vancomycin and cefepime dosing. Tmax is 100.9 and WBC is elevated at 16.4. SCr is WNL.   Plan: Cefepime 1g IV Q8H Vanc 1gm IV Q8H F/u renal fxn, C&S, clinical status and trough at SS  Height: 5\' 9"  (175.3 cm) Weight: 177 lb 7.5 oz (80.5 kg) IBW/kg (Calculated) : 70.7  Temp (24hrs), Avg:99.9 F (37.7 C), Min:99.1 F (37.3 C), Max:100.9 F (38.3 C)   Recent Labs Lab 09/03/17 0515 09/04/17 0306 09/05/17 0306 09/06/17 0445 09/06/17 0619 09/07/17 0209 09/08/17 0500  WBC 6.7 8.2 10.5  --  11.2*  --  16.4*  CREATININE 0.69 0.70 0.57* 0.62  --  0.65 0.72    Estimated Creatinine Clearance: 128.9 mL/min (by C-G formula based on SCr of 0.72 mg/dL).    No Known Allergies  Antimicrobials this admission: Vanc 9/9>> Cefepime 9/9>>  Dose adjustments this admission: N/A  Microbiology results: 9/8 TA - mod staph aureus 9/8 Urine - pseudomonas 9/1 MRSA - NEG  Thank you for allowing pharmacy to be a part of this patient's care.  Saida Lonon, Drake LeachRachel Lynn 09/08/2017 12:55 PM
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
## 2017-09-09 LAB — GLUCOSE, CAPILLARY
GLUCOSE-CAPILLARY: 108 mg/dL — AB (ref 65–99)
GLUCOSE-CAPILLARY: 118 mg/dL — AB (ref 65–99)
GLUCOSE-CAPILLARY: 136 mg/dL — AB (ref 65–99)
Glucose-Capillary: 111 mg/dL — ABNORMAL HIGH (ref 65–99)
Glucose-Capillary: 129 mg/dL — ABNORMAL HIGH (ref 65–99)
Glucose-Capillary: 139 mg/dL — ABNORMAL HIGH (ref 65–99)
Glucose-Capillary: 145 mg/dL — ABNORMAL HIGH (ref 65–99)

## 2017-09-09 LAB — CULTURE, RESPIRATORY W GRAM STAIN: Special Requests: NORMAL

## 2017-09-09 LAB — URINE CULTURE: SPECIAL REQUESTS: NORMAL

## 2017-09-09 LAB — CULTURE, RESPIRATORY
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
## 2017-09-10 ENCOUNTER — Inpatient Hospital Stay (HOSPITAL_COMMUNITY): Payer: Medicaid Other

## 2017-09-10 LAB — GLUCOSE, CAPILLARY
GLUCOSE-CAPILLARY: 113 mg/dL — AB (ref 65–99)
GLUCOSE-CAPILLARY: 91 mg/dL (ref 65–99)
GLUCOSE-CAPILLARY: 97 mg/dL (ref 65–99)
Glucose-Capillary: 107 mg/dL — ABNORMAL HIGH (ref 65–99)
Glucose-Capillary: 112 mg/dL — ABNORMAL HIGH (ref 65–99)
Glucose-Capillary: 94 mg/dL (ref 65–99)

## 2017-09-10 LAB — CBC
HCT: 34.3 % — ABNORMAL LOW (ref 39.0–52.0)
Hemoglobin: 11 g/dL — ABNORMAL LOW (ref 13.0–17.0)
MCH: 27.8 pg (ref 26.0–34.0)
MCHC: 32.1 g/dL (ref 30.0–36.0)
MCV: 86.8 fL (ref 78.0–100.0)
PLATELETS: 396 10*3/uL (ref 150–400)
RBC: 3.95 MIL/uL — AB (ref 4.22–5.81)
RDW: 13.1 % (ref 11.5–15.5)
WBC: 10.5 10*3/uL (ref 4.0–10.5)

## 2017-09-10 LAB — BASIC METABOLIC PANEL
ANION GAP: 8 (ref 5–15)
BUN: 26 mg/dL — ABNORMAL HIGH (ref 6–20)
CALCIUM: 8.9 mg/dL (ref 8.9–10.3)
CHLORIDE: 102 mmol/L (ref 101–111)
CO2: 28 mmol/L (ref 22–32)
CREATININE: 0.61 mg/dL (ref 0.61–1.24)
GFR calc non Af Amer: >60 mL/min (ref >60)
Glucose, Bld: 114 mg/dL — ABNORMAL HIGH (ref 65–99)
Potassium: 3.9 mmol/L (ref 3.5–5.1)
SODIUM: 138 mmol/L (ref 135–145)

## 2017-09-10 MED ORDER — IPRATROPIUM-ALBUTEROL 0.5-2.5 (3) MG/3ML IN SOLN: 3.0000 mL | Freq: Four times a day (QID) | RESPIRATORY_TRACT | Status: DC | PRN

## 2017-09-10 NOTE — Progress Notes (Signed)
Pt extubated per MD order, placed on 4L [place]. Pt in no distress at this time, positive cuff leak prior to extubation, no stridor after. Pt not following commands, so was not able to speak. RN suctioned mouth well. Sats 94%. Will cont to monitor.

## 2017-09-10 NOTE — Progress Notes (Signed)
Patient ID: Timothy Potts, male   DOB: Oct 29, 1981, 36 y.o.   MRN: [id number] Doing well S/P extubation.  Violeta GelinasBurke Timothy Mancini, MD, MPH, FACS Trauma: [phone] General Surgery: [phone]
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
## 2017-09-10 NOTE — Progress Notes (Signed)
Pt weaning on CPAP/PS 5/5, tolerating ok. Volumes are around 350, RR 26. Pt still on some Fentanyl. Will cont to monitor and assess for extubation.

## 2017-09-10 NOTE — Plan of Care (Signed)
Problem: Skin Integrity: Goal: Risk for impaired skin integrity will decrease Outcome: Progressing Turning patient q2h and using appropriate skin care products.

## 2017-09-11 ENCOUNTER — Inpatient Hospital Stay (HOSPITAL_COMMUNITY): Payer: Medicaid Other

## 2017-09-11 LAB — GLUCOSE, CAPILLARY
GLUCOSE-CAPILLARY: 106 mg/dL — AB (ref 65–99)
GLUCOSE-CAPILLARY: 112 mg/dL — AB (ref 65–99)
GLUCOSE-CAPILLARY: 116 mg/dL — AB (ref 65–99)
GLUCOSE-CAPILLARY: 129 mg/dL — AB (ref 65–99)
GLUCOSE-CAPILLARY: 134 mg/dL — AB (ref 65–99)
GLUCOSE-CAPILLARY: 96 mg/dL (ref 65–99)

## 2017-09-11 MED ORDER — CLONAZEPAM 0.5 MG PO TABS
0.5000 mg | ORAL_TABLET | Freq: Two times a day (BID) | ORAL | Status: DC
  Administered 2017-09-11: 0.5 mg
  Filled 2017-09-11 (×2): qty 1

## 2017-09-11 MED ORDER — LEVETIRACETAM 100 MG/ML PO SOLN
1500.0000 mg | Freq: Two times a day (BID) | ORAL | Status: DC
  Administered 2017-09-11 – 2017-09-26 (×29): 1500 mg
  Filled 2017-09-11 (×33): qty 15

## 2017-09-11 MED ORDER — QUETIAPINE FUMARATE 25 MG PO TABS
50.0000 mg | ORAL_TABLET | Freq: Two times a day (BID) | ORAL | Status: DC
  Administered 2017-09-11: 50 mg
  Filled 2017-09-11 (×2): qty 2

## 2017-09-11 MED ORDER — ORAL CARE MOUTH RINSE
15.0000 mL | Freq: Four times a day (QID) | OROMUCOSAL | Status: DC
  Administered 2017-09-12 – 2017-09-26 (×52): 15 mL via OROMUCOSAL

## 2017-09-11 MED ORDER — MORPHINE SULFATE (PF) 4 MG/ML IV SOLN
2.0000 mg | INTRAVENOUS | Status: DC | PRN
  Administered 2017-09-14: 4 mg via INTRAVENOUS
  Administered 2017-09-14: 2 mg via INTRAVENOUS
  Administered 2017-09-16: 4 mg via INTRAVENOUS
  Administered 2017-09-25: 2 mg via INTRAVENOUS
  Filled 2017-09-11 (×4): qty 1

## 2017-09-11 NOTE — Progress Notes (Signed)
Cortrak Tube Team Note:  Consult received to place a Cortrak feeding tube.   A 10 F Cortrak tube was placed in the right nare and secured with a nasal bridle at 75 cm. Per the Cortrak monitor reading the tube tip is post pyloric.   X-ray is required, abdominal x-ray has been ordered by the Cortrak team. Please confirm tube placement before using the Cortrak tube.   If the tube becomes dislodged please keep the tube and contact the Cortrak team at [email or web address] (password [email or web address]) for replacement.  If after hours and replacement cannot be delayed, place a NG tube and confirm placement with an abdominal x-ray.    Leroy Trim RD, LDN Clinical Nutrition Pager # - [phone]   
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
## 2017-09-11 NOTE — Progress Notes (Signed)
Rehab Admissions Coordinator Note:  Patient was screened by Clois DupesBoyette, Andriea Hasegawa Godwin for appropriateness for an Inpatient Acute Rehab Consult per TBI team recommendations. Pt currenlty [place] per team.   At this time, we are recommending await further progress with therapy before determining rehab venue options. Clois Dupes.  Xana Bradt Godwin 09/11/2017, 4:09 PM  I can be reached at [phone].
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
## 2017-09-11 NOTE — Progress Notes (Signed)
OT Cancellation Note  Patient Details Name: Timothy CoombsJeremy L Potts MRN: [id number] DOB: 05-14-1981   Cancelled Treatment:    Reason Eval/Treat Not Completed: Other (comment). Pt currently on bedrest. Please update activity orders when appropriate for therapy.   [facility] Association[facility],HILLARY  Teleah Villamar, OT/L  [phone] 09/11/2017 09/11/2017, 7:40 AM
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
## 2017-09-11 NOTE — Progress Notes (Signed)
PT Cancellation Note  Patient Details Name: Timothy CoombsJeremy L Potts MRN: [id number] DOB: 1981/07/04   Cancelled Treatment:    Reason Eval/Treat Not Completed: Patient not medically ready. Pt currently on bedrest. Will await increased activity orders prior to initiating PT eval.    Marylynn PearsonLaura D Kynslie Ringle 09/11/2017, 7:34 AM   Conni SlipperLaura Paticia Moster, PT, DPT Acute Rehabilitation Services Pager: [phone]
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
## 2017-09-12 ENCOUNTER — Encounter (HOSPITAL_COMMUNITY): Payer: Self-pay | Admitting: *Deleted

## 2017-09-12 LAB — GLUCOSE, CAPILLARY
GLUCOSE-CAPILLARY: 112 mg/dL — AB (ref 65–99)
GLUCOSE-CAPILLARY: 124 mg/dL — AB (ref 65–99)
GLUCOSE-CAPILLARY: 120 mg/dL — AB (ref 65–99)
Glucose-Capillary: 125 mg/dL — ABNORMAL HIGH (ref 65–99)
Glucose-Capillary: 131 mg/dL — ABNORMAL HIGH (ref 65–99)

## 2017-09-12 LAB — CBC
HCT: 37.7 % — ABNORMAL LOW (ref 39.0–52.0)
HEMOGLOBIN: 12.5 g/dL — AB (ref 13.0–17.0)
MCH: 28.5 pg (ref 26.0–34.0)
MCHC: 33.2 g/dL (ref 30.0–36.0)
MCV: 85.9 fL (ref 78.0–100.0)
PLATELETS: 466 10*3/uL — AB (ref 150–400)
RBC: 4.39 MIL/uL (ref 4.22–5.81)
RDW: 12.8 % (ref 11.5–15.5)
WBC: 9.4 10*3/uL (ref 4.0–10.5)

## 2017-09-12 LAB — CULTURE, BLOOD (SINGLE)
Culture: NO GROWTH
Special Requests: ADEQUATE

## 2017-09-12 LAB — BASIC METABOLIC PANEL
ANION GAP: 8 (ref 5–15)
BUN: 23 mg/dL — ABNORMAL HIGH (ref 6–20)
CHLORIDE: 103 mmol/L (ref 101–111)
CO2: 27 mmol/L (ref 22–32)
Calcium: 9.3 mg/dL (ref 8.9–10.3)
Creatinine, Ser: 0.59 mg/dL — ABNORMAL LOW (ref 0.61–1.24)
GFR calc Af Amer: >60 mL/min (ref >60)
Glucose, Bld: 107 mg/dL — ABNORMAL HIGH (ref 65–99)
POTASSIUM: 4.1 mmol/L (ref 3.5–5.1)
SODIUM: 138 mmol/L (ref 135–145)

## 2017-09-12 MED ORDER — LORAZEPAM 2 MG/ML IJ SOLN
1.0000 mg | INTRAMUSCULAR | Status: DC | PRN
  Administered 2017-09-25: 1 mg via INTRAVENOUS
  Filled 2017-09-12: qty 1

## 2017-09-12 NOTE — Plan of Care (Signed)
Problem: Respiratory: Goal: Ability to maintain a clear airway and adequate ventilation will improve Outcome: Progressing Extubated, strong cough, clearing secretions

## 2017-09-12 NOTE — Progress Notes (Signed)
   Subjective/Chief Complaint: Patient somnolent. Minimally arouses with verbal and tactile stimuli.  In C collar and not able to open mouth on command.  Occlusion grossly normal , minimal facial edema.    Objective: Vital signs in last 24 hours: Temp:  [98.6 F (37 C)-99.2 F (37.3 C)] 99 F (37.2 C) (09/13 1552) Pulse Rate:  [87-116] 105 (09/13 1600) Resp:  [15-35] 20 (09/13 1600) BP: (104-142)/(65-103) 115/81 (09/13 1600) SpO2:  [92 %-100 %] 96 % (09/13 1600) Weight:  [74.8 kg (164 lb 14.5 oz)] 74.8 kg (164 lb 14.5 oz) (09/13 0500) Last BM Date: 09/11/17  Intake/Output from previous day: 09/12 0701 - 09/13 0700 In: 1833 [I.V.:250; ZO/XW:9604G/GT:1268; IV Piggyback:315] Out: 800 [Urine:800] Intake/Output this shift: Total I/O In: 630 [I.V.:90; NG/GT:540] Out: 750 [Urine:750]    Lab Results:   Recent Labs  09/10/17 0500 09/12/17 0213  WBC 10.5 9.4  HGB 11.0* 12.5*  HCT 34.3* 37.7*  PLT 396 466*   BMET  Recent Labs  09/10/17 0500 09/12/17 0213  NA 138 138  K 3.9 4.1  CL 102 103  CO2 28 27  GLUCOSE 114* 107*  BUN 26* 23*  CREATININE 0.61 0.59*  CALCIUM 8.9 9.3   PT/INR No results for input(s): LABPROT, INR in the last 72 hours. ABG No results for input(s): PHART, HCO3 in the last 72 hours.  Invalid input(s): PCO2, PO2  Studies/Results: Dg Abd Portable 1v  Result Date: 09/11/2017 CLINICAL DATA:  Feeding tube placement. EXAM: PORTABLE ABDOMEN - 1 VIEW COMPARISON:  09/04/2017 FINDINGS: Nasogastric removed. Feeding tube terminates at the gastric antrum or proximal pylorus. Non-obstructive bowel gas pattern. IMPRESSION: Feeding tube terminating at the antropyloric region. Electronically Signed   By: Jeronimo GreavesKyle  Talbot M.D.   On: 09/11/2017 09:53    Anti-infectives: Anti-infectives    Start     Dose/Rate Route Frequency Ordered Stop   09/08/17 1400  ceFEPIme (MAXIPIME) 1 g in dextrose 5 % 50 mL IVPB     1 g 100 mL/hr over 30 Minutes Intravenous Every 8 hours  09/08/17 1253     09/08/17 1400  vancomycin (VANCOCIN) IVPB 1000 mg/200 mL premix  Status:  Discontinued     1,000 mg 200 mL/hr over 60 Minutes Intravenous Every 8 hours 09/08/17 1254 09/10/17 0856   09/07/17 1400  piperacillin-tazobactam (ZOSYN) IVPB 3.375 g  Status:  Discontinued     3.375 g 12.5 mL/hr over 240 Minutes Intravenous Every 8 hours 09/07/17 1031 09/08/17 1244      Assessment/Plan: s/p * No surgery found * Follow up of right orbit, maxillary sinus fractures and nasal fractures- appears stable . Will plan to follow up next week for further evaluation.   LOS: 15 days    RAYBURN,SHAWN,PA-C Plastic Surgery [phone]
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
## 2017-09-13 DIAGNOSIS — S069X4S Unspecified intracranial injury with loss of consciousness of 6 hours to 24 hours, sequela: Secondary | ICD-10-CM

## 2017-09-13 LAB — GLUCOSE, CAPILLARY
GLUCOSE-CAPILLARY: 112 mg/dL — AB (ref 65–99)
GLUCOSE-CAPILLARY: 120 mg/dL — AB (ref 65–99)
GLUCOSE-CAPILLARY: 128 mg/dL — AB (ref 65–99)
Glucose-Capillary: 118 mg/dL — ABNORMAL HIGH (ref 65–99)
Glucose-Capillary: 135 mg/dL — ABNORMAL HIGH (ref 65–99)
Glucose-Capillary: 119 mg/dL — ABNORMAL HIGH (ref 65–99)

## 2017-09-13 LAB — BASIC METABOLIC PANEL
ANION GAP: 9 (ref 5–15)
BUN: 19 mg/dL (ref 6–20)
CALCIUM: 9.5 mg/dL (ref 8.9–10.3)
CHLORIDE: 102 mmol/L (ref 101–111)
CO2: 26 mmol/L (ref 22–32)
CREATININE: 0.52 mg/dL — AB (ref 0.61–1.24)
GFR calc Af Amer: >60 mL/min (ref >60)
GFR calc non Af Amer: >60 mL/min (ref >60)
GLUCOSE: 124 mg/dL — AB (ref 65–99)
Potassium: 3.9 mmol/L (ref 3.5–5.1)
Sodium: 137 mmol/L (ref 135–145)

## 2017-09-13 LAB — CBC
HEMATOCRIT: 40.9 % (ref 39.0–52.0)
HEMOGLOBIN: 13.6 g/dL (ref 13.0–17.0)
MCH: 28.5 pg (ref 26.0–34.0)
MCHC: 33.3 g/dL (ref 30.0–36.0)
MCV: 85.7 fL (ref 78.0–100.0)
Platelets: 491 10*3/uL — ABNORMAL HIGH (ref 150–400)
RBC: 4.77 MIL/uL (ref 4.22–5.81)
RDW: 12.8 % (ref 11.5–15.5)
WBC: 9.1 10*3/uL (ref 4.0–10.5)
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
## 2017-09-13 NOTE — Progress Notes (Signed)
Trauma Service Note  Subjective: Patient is extubated and breathing wll on his own, but not following commands for me at all.  No distress  Objective: Vital signs in last 24 hours: Temp:  [98.2 F (36.8 C)-99.2 F (37.3 C)] 98.6 F (37 C) (09/14 0800) Pulse Rate:  [84-107] 97 (09/14 1001) Resp:  [15-30] 27 (09/14 1001) BP: (103-130)/(65-94) 109/88 (09/14 1001) SpO2:  [91 %-99 %] 97 % (09/14 1001) Weight:  [73.7 kg (162 lb 7.7 oz)] 73.7 kg (162 lb 7.7 oz) (09/14 0500) Last BM Date: 09/12/17  Intake/Output from previous day: 09/13 0701 - 09/14 0700 In: 1710 [I.V.:230; NG/GT:1380; IV Piggyback:100] Out: 1650 [Urine:1650] Intake/Output this shift: Total I/O In: 330 [I.V.:40; NG/GT:240; IV Piggyback:50] Out: 50 [Urine:50]  General: No acute distress.  Lungs: Clear, RR 27, saturations are good.  Not on high flow oxygen  Abd: Soft and benign.  Tolerating tube feedings well  Extremities: No clinical signs or symptoms of DVT  Neuro: Intact  Lab Results: CBC   Recent Labs  09/12/17 0213 09/13/17 0225  WBC 9.4 9.1  HGB 12.5* 13.6  HCT 37.7* 40.9  PLT 466* 491*   BMET  Recent Labs  09/12/17 0213 09/13/17 0225  NA 138 137  K 4.1 3.9  CL 103 102  CO2 27 26  GLUCOSE 107* 124*  BUN 23* 19  CREATININE 0.59* 0.52*  CALCIUM 9.3 9.5   PT/INR No results for input(s): LABPROT, INR in the last 72 hours. ABG No results for input(s): PHART, HCO3 in the last 72 hours.  Invalid input(s): PCO2, PO2  Studies/Results: No results found.  Anti-infectives: Anti-infectives    Start     Dose/Rate Route Frequency Ordered Stop   09/08/17 1400  ceFEPIme (MAXIPIME) 1 g in dextrose 5 % 50 mL IVPB     1 g 100 mL/hr over 30 Minutes Intravenous Every 8 hours 09/08/17 1253     09/08/17 1400  vancomycin (VANCOCIN) IVPB 1000 mg/200 mL premix  Status:  Discontinued     1,000 mg 200 mL/hr over 60 Minutes Intravenous Every 8 hours 09/08/17 1254 09/10/17 0856   09/07/17 1400   piperacillin-tazobactam (ZOSYN) IVPB 3.375 g  Status:  Discontinued     3.375 g 12.5 mL/hr over 240 Minutes Intravenous Every 8 hours 09/07/17 1031 09/08/17 1244      Assessment/Plan: s/p  Transfer to SDU  LOS: 16 days   Marta Lamas. Gae Bon, MD, FACS [phone] Trauma Surgeon 09/13/2017
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
## 2017-09-13 NOTE — Clinical Social Work Note (Signed)
Clinical Social Worker following alongside for support to patient and family as needed.  Patient currently a Rancho III, therefore not yet appropriate for SBIRT completion.  CSW remains available as needed for support, SBIRT completion, and potential discharge planning needs.  Macario Golds, [place] [phone]

## 2017-09-13 NOTE — Progress Notes (Signed)
Inpatient Rehabilitation  Attempted to meet with patient to discuss team's recommendation for IP Rehab. No family at bedside; however, left booklets and will plan to follow up with family.  Will also follow for improved therapy tolerance and progression to a Rancho Level IV prior to potential IP Rehab admission.  Please call if questions.   Charlane Ferretti., CCC/SLP Admission Coordinator  [facility] Inpatient Rehabilitation  Cell [phone]

## 2017-09-14 LAB — GLUCOSE, CAPILLARY
GLUCOSE-CAPILLARY: 135 mg/dL — AB (ref 65–99)
GLUCOSE-CAPILLARY: 124 mg/dL — AB (ref 65–99)
Glucose-Capillary: 104 mg/dL — ABNORMAL HIGH (ref 65–99)
Glucose-Capillary: 106 mg/dL — ABNORMAL HIGH (ref 65–99)
Glucose-Capillary: 119 mg/dL — ABNORMAL HIGH (ref 65–99)
Glucose-Capillary: 123 mg/dL — ABNORMAL HIGH (ref 65–99)
Glucose-Capillary: 132 mg/dL — ABNORMAL HIGH (ref 65–99)

## 2017-09-14 MED ORDER — CHLORHEXIDINE GLUCONATE 0.12 % MT SOLN
OROMUCOSAL | Status: AC
  Filled 2017-09-14: qty 15

## 2017-09-14 MED ORDER — LAMOTRIGINE 25 MG PO TABS
150.0000 mg | ORAL_TABLET | Freq: Two times a day (BID) | ORAL | Status: DC
  Administered 2017-09-14 – 2017-09-26 (×24): 150 mg
  Filled 2017-09-14 (×9): qty 6
  Filled 2017-09-14: qty 2
  Filled 2017-09-14 (×13): qty 6
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
## 2017-09-15 LAB — BASIC METABOLIC PANEL
ANION GAP: 9 (ref 5–15)
BUN: 28 mg/dL — ABNORMAL HIGH (ref 6–20)
CALCIUM: 9.4 mg/dL (ref 8.9–10.3)
CHLORIDE: 105 mmol/L (ref 101–111)
CO2: 24 mmol/L (ref 22–32)
Creatinine, Ser: 0.62 mg/dL (ref 0.61–1.24)
GFR calc non Af Amer: >60 mL/min (ref >60)
GLUCOSE: 114 mg/dL — AB (ref 65–99)
Potassium: 4.4 mmol/L (ref 3.5–5.1)
Sodium: 138 mmol/L (ref 135–145)

## 2017-09-15 LAB — GLUCOSE, CAPILLARY
GLUCOSE-CAPILLARY: 130 mg/dL — AB (ref 65–99)
GLUCOSE-CAPILLARY: 115 mg/dL — AB (ref 65–99)
Glucose-Capillary: 118 mg/dL — ABNORMAL HIGH (ref 65–99)
Glucose-Capillary: 122 mg/dL — ABNORMAL HIGH (ref 65–99)
Glucose-Capillary: 129 mg/dL — ABNORMAL HIGH (ref 65–99)
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
## 2017-09-15 NOTE — Progress Notes (Signed)
Spoke with Felicie Morn, patient's father by phone and told him that patient was being moved to 4E16.

## 2017-09-15 NOTE — Plan of Care (Signed)
Problem: Skin Integrity: Goal: Risk for impaired skin integrity will decrease Outcome: Progressing Turn q2h and utilizing protective skin products  Problem: Nutrition: Goal: Adequate nutrition will be maintained Outcome: Progressing TF infusing for nutrition  Problem: Physical Regulation: Goal: Will regain or maintain usual level of consciousness Outcome: Progressing Patient will open eyes to verbal command  Problem: Skin Integrity: Goal: Demonstration of wound healing without infection will improve Outcome: Progressing No s/s of infection at this time  Problem: Psychosocial: Goal: Ability to verbalize positive feelings about self will improve Outcome: Not Progressing Pt non-verbal at this time.  Problem: Nutritional: Goal: Risk of aspiration will decrease Outcome: Progressing Patient upright and able to cough spontaneously   Problem: Education: Goal: Knowledge of the prescribed therapeutic regimen Outcome: Not Progressing Unable to assess patients knowledge at this time.  Problem: Respiratory: Goal: Ability to maintain a clear airway and adequate ventilation will improve Outcome: Progressing Oxygen saturations maintaining >96% on 2l/min [place]

## 2017-09-16 LAB — BASIC METABOLIC PANEL
Anion gap: 6 (ref 5–15)
BUN: 24 mg/dL — AB (ref 6–20)
CALCIUM: 9.4 mg/dL (ref 8.9–10.3)
CO2: 30 mmol/L (ref 22–32)
Chloride: 102 mmol/L (ref 101–111)
Creatinine, Ser: 0.58 mg/dL — ABNORMAL LOW (ref 0.61–1.24)
GFR calc Af Amer: >60 mL/min (ref >60)
Glucose, Bld: 102 mg/dL — ABNORMAL HIGH (ref 65–99)
POTASSIUM: 4 mmol/L (ref 3.5–5.1)
SODIUM: 138 mmol/L (ref 135–145)

## 2017-09-16 LAB — CBC
HCT: 39.9 % (ref 39.0–52.0)
Hemoglobin: 13.1 g/dL (ref 13.0–17.0)
MCH: 28.3 pg (ref 26.0–34.0)
MCHC: 32.8 g/dL (ref 30.0–36.0)
MCV: 86.2 fL (ref 78.0–100.0)
PLATELETS: 492 10*3/uL — AB (ref 150–400)
RBC: 4.63 MIL/uL (ref 4.22–5.81)
RDW: 13 % (ref 11.5–15.5)
WBC: 10.2 10*3/uL (ref 4.0–10.5)

## 2017-09-16 LAB — GLUCOSE, CAPILLARY
GLUCOSE-CAPILLARY: 104 mg/dL — AB (ref 65–99)
GLUCOSE-CAPILLARY: 110 mg/dL — AB (ref 65–99)
GLUCOSE-CAPILLARY: 121 mg/dL — AB (ref 65–99)
GLUCOSE-CAPILLARY: 129 mg/dL — AB (ref 65–99)
GLUCOSE-CAPILLARY: 135 mg/dL — AB (ref 65–99)
Glucose-Capillary: 96 mg/dL (ref 65–99)

## 2017-09-16 NOTE — Progress Notes (Addendum)
Family meeting scheduled for Friday, September 21 at 8:30  to discuss discharge planning/pt prognosis.    Quintella Baton, RN, BSN  Trauma/Neuro ICU Case Manager [phone]
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
## 2017-09-16 NOTE — Progress Notes (Addendum)
Inpatient Rehabilitation  Reviewed updated therapy notes; patient remains a Rancho Level III.  I called dad, Kendryck Lacroix and spoke about the team's recommendation for IP Rehab as well as candidacy for our program (Rancho Level IV, therapy tolerance, and some discharge support).  He states that no one can care for him unless he gets almost independent like before.  They expressed interest in SNFs in [place].  I have passed information on to CSW.  Will plan to continue to follow at a distance in hopes of progression and an established discharge plan.  Please call with questions.   Charlane Ferretti., CCC/SLP Admission Coordinator  [facility] Inpatient Rehabilitation  Cell [phone]
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
## 2017-09-17 LAB — GLUCOSE, CAPILLARY
GLUCOSE-CAPILLARY: 108 mg/dL — AB (ref 65–99)
GLUCOSE-CAPILLARY: 117 mg/dL — AB (ref 65–99)
GLUCOSE-CAPILLARY: 127 mg/dL — AB (ref 65–99)
GLUCOSE-CAPILLARY: 100 mg/dL — AB (ref 65–99)
Glucose-Capillary: 124 mg/dL — ABNORMAL HIGH (ref 65–99)
Glucose-Capillary: 130 mg/dL — ABNORMAL HIGH (ref 65–99)

## 2017-09-17 MED ORDER — PIVOT 1.5 CAL PO LIQD
1000.0000 mL | ORAL | Status: AC
  Administered 2017-09-17 – 2017-09-22 (×7): 1000 mL
  Filled 2017-09-17 (×13): qty 1000
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
## 2017-09-18 LAB — GLUCOSE, CAPILLARY
GLUCOSE-CAPILLARY: 117 mg/dL — AB (ref 65–99)
GLUCOSE-CAPILLARY: 106 mg/dL — AB (ref 65–99)
GLUCOSE-CAPILLARY: 121 mg/dL — AB (ref 65–99)
Glucose-Capillary: 122 mg/dL — ABNORMAL HIGH (ref 65–99)
Glucose-Capillary: 111 mg/dL — ABNORMAL HIGH (ref 65–99)
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
## 2017-09-18 NOTE — Progress Notes (Signed)
Inpatient Rehabilitation  No therapy notes from today to review.  I will be off Thursday and Friday; however my co-workers will be covering if needed.  Note plans for a family meeting Friday.  Britta Mccreedy or Genie can be reached if needed.  Charlane Ferretti., CCC/SLP Admission Coordinator  [facility] Inpatient Rehabilitation  Cell [phone]

## 2017-09-19 LAB — GLUCOSE, CAPILLARY
GLUCOSE-CAPILLARY: 156 mg/dL — AB (ref 65–99)
GLUCOSE-CAPILLARY: 117 mg/dL — AB (ref 65–99)
GLUCOSE-CAPILLARY: 110 mg/dL — AB (ref 65–99)
Glucose-Capillary: 113 mg/dL — ABNORMAL HIGH (ref 65–99)
Glucose-Capillary: 118 mg/dL — ABNORMAL HIGH (ref 65–99)
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
## 2017-09-20 LAB — GLUCOSE, CAPILLARY
GLUCOSE-CAPILLARY: 102 mg/dL — AB (ref 65–99)
GLUCOSE-CAPILLARY: 135 mg/dL — AB (ref 65–99)
GLUCOSE-CAPILLARY: 119 mg/dL — AB (ref 65–99)
Glucose-Capillary: 113 mg/dL — ABNORMAL HIGH (ref 65–99)

## 2017-09-20 NOTE — Clinical Social Work Note (Signed)
Clinical Social Work Assessment  Patient Details  Name: Timothy Potts MRN: [id number] Date of Birth: 1981/11/28  Date of referral:  09/20/17               Reason for consult:  Facility Placement                Permission sought to share information with:  Facility Medical sales representative, Family Supports Permission granted to share information::  Yes, Verbal Permission Granted  Name::     Sallee Lange::  SNFs  Relationship::  Father  Contact Information:  [phone]  Housing/Transportation Living arrangements for the past 2 months:  Single Family Home Source of Information:  Parent Patient Interpreter Needed:  None Criminal Activity/Legal Involvement Pertinent to Current Situation/Hospitalization:  No - Comment as needed Significant Relationships:  Other Family Members, Parents, Siblings Lives with:  Other (Comment) (Grandmother) Do you feel safe going back to the place where you live?  No Need for family participation in patient care:  Yes (Comment)  Care giving concerns:  CSW received consult for possible SNF placement at time of discharge. Patient not able to communicate. CSW attended family meeting with MD, patient's father, grandmother, brother, and sister-in-law. CSW spoke with patient's family regarding recommendation of SNF placement at time of discharge. Patient's father reported that patient's family is currently unable to care for patient at their home given patient's current physical and cognitive needs. He reported that he is unable to pay for SNF since the patient does not have insurance. Patient's father is agreeable to SNF placement at time of discharge. CSW to continue to follow and assist with discharge planning needs.   Social Worker assessment / plan:  CSW spoke with patient's family concerning possibility of rehab at [facility] before returning home.  Employment status:  Unemployed Health and safety inspector:  Self Pay (Medicaid Pending) PT Recommendations:  Inpatient  Rehab Consult Information / Referral to community resources:  Skilled Nursing Facility  Patient/Family's Response to care:  Patient's family recognizes need for rehab and is agreeable to a SNF. CSW explained that patient would require an LOG bed and that may be farther away. Family expressed understanding even though they would be disappointed to drive farther to check on him.  Patient/Family's Understanding of and Emotional Response to Diagnosis, Current Treatment, and Prognosis:  Patient's family is realistic regarding therapy needs and expressed being hopeful for SNF placement. Patient's family expressed understanding of CSW role and discharge process as well as medical condition. No questions/concerns about plan or treatment.    Emotional Assessment Appearance:  Appears stated age Attitude/Demeanor/Rapport:  Unable to Assess Affect (typically observed):  Unable to Assess Orientation:   (Not oriented) Alcohol / Substance use:  Not Applicable Psych involvement (Current and /or in the community):  No (Comment)  Discharge Needs  Concerns to be addressed:  Care Coordination Readmission within the last 30 days:  No Current discharge risk:  Dependent with Mobility, Inadequate Financial Supports, Cognitively Impaired Barriers to Discharge:  Continued Medical Work up   [employer], LCSWA 09/20/2017, 9:18 AM
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
## 2017-09-20 NOTE — NC FL2 (Signed)
[place] LEVEL OF CARE SCREENING TOOL     IDENTIFICATION  Patient Name: Timothy Potts Birthdate: Jun 04, 1981 Sex: male Admission Date (Current Location): 08/28/2017  [facility] [place] Number:  Engineering geologist and Address:  [facility] [place] [address], [place], [place] [address]      Provider Number: [id number]  Attending Physician Name and Address:  Md, Trauma, MD  Relative Name and Phone Number:  Bennetta Laos, [phone]    Current Level of Care: Hospital Recommended Level of Care: [place] Prior Approval Number:    Date Approved/Denied:   PASRR Number:   [id number]  Discharge Plan: SNF    Current Diagnoses: Patient Active Problem List   Diagnosis Date Noted  . TBI (traumatic brain injury) ([place]) 08/28/2017    Orientation RESPIRATION BLADDER Height & Weight      (Responds to voice; unable to follow commands)  Normal Incontinent, External catheter Weight: 73.3 kg (161 lb 9.6 oz) Height:  5' 9"  (175.3 cm)  BEHAVIORAL SYMPTOMS/MOOD NEUROLOGICAL BOWEL NUTRITION STATUS      Incontinent Feeding tube  AMBULATORY STATUS COMMUNICATION OF NEEDS Skin   Total Care Does not communicate                         Personal Care Assistance Level of Assistance  Bathing, Feeding, Dressing Bathing Assistance: Maximum assistance Feeding assistance: Maximum assistance Dressing Assistance: Maximum assistance     Functional Limitations Info  Speech     Speech Info: Impaired    SPECIAL CARE FACTORS FREQUENCY  PT (By licensed PT), OT (By licensed OT), Speech therapy     PT Frequency: 5x/week OT Frequency: 3x/week     Speech Therapy Frequency: 3x/week      Contractures      Additional Factors Info  Insulin Sliding Scale Code Status Info: Full Allergies Info: NKA   Insulin Sliding Scale Info: Every 4 hours       Current Medications (09/20/2017):  This is the current hospital active medication  list Current Facility-Administered Medications  Medication Dose Route Frequency Provider Last Rate Last Dose  . 0.9 % NaCl with KCl 20 mEq/ L  infusion   Intravenous Continuous Georganna Skeans, MD 10 mL/hr at 09/19/17 1715    . acetaminophen (TYLENOL) solution 650 mg  650 mg Per Tube Q6H PRN Georganna Skeans, MD   650 mg at 09/07/17 0810  . bethanechol (URECHOLINE) tablet 25 mg  25 mg Per Tube TID Georganna Skeans, MD   25 mg at 09/19/17 2240  . chlorhexidine gluconate (MEDLINE KIT) (PERIDEX) 0.12 % solution 15 mL  15 mL Mouth Rinse BID Focht, Jessica L, PA   15 mL at 09/19/17 2241  . docusate (COLACE) 50 MG/5ML liquid 100 mg  100 mg Per Tube BID PRN Georganna Skeans, MD   100 mg at 09/07/17 1006  . enoxaparin (LOVENOX) injection 40 mg  40 mg Subcutaneous Q24H Georganna Skeans, MD   40 mg at 09/19/17 8469  . feeding supplement (PIVOT 1.5 CAL) liquid 1,000 mL  1,000 mL Per Tube Continuous Judeth Horn, MD 65 mL/hr at 09/19/17 2222 1,000 mL at 09/19/17 2222  . insulin aspart (novoLOG) injection 0-15 Units  0-15 Units Subcutaneous Q4H Georganna Skeans, MD   2 Units at 09/20/17 0453  . ipratropium-albuterol (DUONEB) 0.5-2.5 (3) MG/3ML nebulizer solution 3 mL  3 mL Nebulization Q6H PRN Georganna Skeans, MD      . lamoTRIgine (  LAMICTAL) tablet 150 mg  150 mg Per Tube BID Judeth Horn, MD   150 mg at 09/19/17 2240  . levETIRAcetam (KEPPRA) 100 MG/ML solution 1,500 mg  1,500 mg Per Tube BID Focht, Jessica L, PA   1,500 mg at 09/19/17 2259  . LORazepam (ATIVAN) injection 1 mg  1 mg Intravenous Q4H PRN Georganna Skeans, MD      . MEDLINE mouth rinse  15 mL Mouth Rinse QID Judeth Horn, MD   15 mL at 09/20/17 0454  . metoprolol tartrate (LOPRESSOR) injection 5 mg  5 mg Intravenous Q6H PRN Focht, Jessica L, PA      . midazolam (VERSED) injection 2-4 mg  2-4 mg Intravenous Q1H PRN Judeth Horn, MD   2 mg at 09/10/17 0524  . morphine 4 MG/ML injection 2-4 mg  2-4 mg Intravenous Q2H PRN Focht, Jessica L, PA   4 mg at  09/16/17 0104  . ondansetron (ZOFRAN-ODT) disintegrating tablet 4 mg  4 mg Oral Q6H PRN Focht, Jessica L, PA       Or  . ondansetron (ZOFRAN) injection 4 mg  4 mg Intravenous Q6H PRN Focht, Jessica L, PA      . pantoprazole (PROTONIX) EC tablet 40 mg  40 mg Oral Daily Focht, Jessica L, PA       Or  . pantoprazole (PROTONIX) injection 40 mg  40 mg Intravenous Daily Focht, Jessica L, PA   40 mg at 09/19/17 1154     Discharge Medications: Please see discharge summary for a list of discharge medications.  Relevant Imaging Results:  Relevant Lab Results:   Additional Information SSN: [id number]  [place], [place]
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
## 2017-09-21 LAB — GLUCOSE, CAPILLARY
GLUCOSE-CAPILLARY: 107 mg/dL — AB (ref 65–99)
GLUCOSE-CAPILLARY: 114 mg/dL — AB (ref 65–99)
Glucose-Capillary: 110 mg/dL — ABNORMAL HIGH (ref 65–99)
Glucose-Capillary: 117 mg/dL — ABNORMAL HIGH (ref 65–99)
Glucose-Capillary: 125 mg/dL — ABNORMAL HIGH (ref 65–99)

## 2017-09-21 LAB — BASIC METABOLIC PANEL
Anion gap: 7 (ref 5–15)
BUN: 23 mg/dL — AB (ref 6–20)
CALCIUM: 9.3 mg/dL (ref 8.9–10.3)
CO2: 26 mmol/L (ref 22–32)
CREATININE: 0.59 mg/dL — AB (ref 0.61–1.24)
Chloride: 106 mmol/L (ref 101–111)
GFR calc Af Amer: >60 mL/min (ref >60)
GFR calc non Af Amer: >60 mL/min (ref >60)
Glucose, Bld: 116 mg/dL — ABNORMAL HIGH (ref 65–99)
POTASSIUM: 4.1 mmol/L (ref 3.5–5.1)
SODIUM: 139 mmol/L (ref 135–145)

## 2017-09-21 NOTE — Progress Notes (Signed)
  General Surgery [facility]  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 Intermittently following commands and openingeyes to voice. R rib FX 5-7 - pulmonary toilet R orbit and max sinus FXs, nasal FX- Dr. Ulice Bold Hx schizophrenia ABL anemia- follow, stable Acute urinary retention- Urecholine, I&O intermittent.   FEN- TF at goal 56mL/hr ID- none currently VTE- PAS, Lovenox  Dispo- SDU, TBI team therapies. CIR vs SNF?         Velora Heckler, MD, [facility]       Office: [phone]    Chief Complaint: TBI  Subjective: Patient in bed, no family in room.  Quiet, not agitated.  Objective: Vital signs in last 24 hours: Temp:  [98.3 F (36.8 C)-98.7 F (37.1 C)] 98.3 F (36.8 C) (09/22 0411) Pulse Rate:  [93-106] 93 (09/22 0411) Resp:  [16-21] 16 (09/22 0411) BP: (116-123)/(79-86) 116/81 (09/22 0411) SpO2:  [94 %-96 %] 95 % (09/22 0411) Weight:  [77.3 kg (170 lb 6.7 oz)] 77.3 kg (170 lb 6.7 oz) (09/22 0411) Last BM Date: 09/20/17  Intake/Output from previous day: 09/21 0701 - 09/22 0700 In: 1614.4 [P.O.:120; I.V.:191.4; NG/GT:1303] Out: 1200 [Urine:1200] Intake/Output this shift: No intake/output data recorded.  Physical Exam: HEENT - sclerae clear, mucous membranes moist Neck - soft, collar in place Chest - clear bilaterally Cor - RRR Abdomen - soft without distension; BS present; non-tender  Lab Results:  No results for input(s): WBC, HGB, HCT, PLT in the last 72 hours. BMET  Recent Labs  09/21/17 0406  NA 139  K 4.1  CL 106  CO2 26  GLUCOSE 116*  BUN 23*  CREATININE 0.59*  CALCIUM 9.3   PT/INR No results for input(s): LABPROT, INR in the last 72 hours. Comprehensive Metabolic Panel:    Component Value Date/Time   NA 139 09/21/2017 0406   NA 138 09/16/2017  0300   K 4.1 09/21/2017 0406   K 4.0 09/16/2017 0300   CL 106 09/21/2017 0406   CL 102 09/16/2017 0300   CO2 26 09/21/2017 0406   CO2 30 09/16/2017 0300   BUN 23 (H) 09/21/2017 0406   BUN 24 (H) 09/16/2017 0300   CREATININE 0.59 (L) 09/21/2017 0406   CREATININE 0.58 (L) 09/16/2017 0300   GLUCOSE 116 (H) 09/21/2017 0406   GLUCOSE 102 (H) 09/16/2017 0300   CALCIUM 9.3 09/21/2017 0406   CALCIUM 9.4 09/16/2017 0300   AST 50 (H) 08/29/2017 0545   AST QUANTITY NOT SUFFICIENT, UNABLE TO PERFORM TEST 08/29/2017 0216   ALT 20 08/29/2017 0545   ALT QUANTITY NOT SUFFICIENT, UNABLE TO PERFORM TEST 08/29/2017 0216   ALKPHOS 60 08/29/2017 0545   ALKPHOS 71 08/29/2017 0216   BILITOT 1.2 08/29/2017 0545   BILITOT QUANTITY NOT SUFFICIENT, UNABLE TO PERFORM TEST 08/29/2017 0216   PROT 6.4 (L) 08/29/2017 0545   PROT 6.9 08/29/2017 0216   ALBUMIN 3.5 08/29/2017 0545   ALBUMIN 4.0 08/29/2017 0216    Studies/Results: No results found.    Timothy Potts 09/21/2017  Patient ID: Timothy Potts, male   DOB: [birth year], 36 y.o.   MRN: [id number]

## 2017-09-22 LAB — GLUCOSE, CAPILLARY
GLUCOSE-CAPILLARY: 127 mg/dL — AB (ref 65–99)
GLUCOSE-CAPILLARY: 129 mg/dL — AB (ref 65–99)
Glucose-Capillary: 121 mg/dL — ABNORMAL HIGH (ref 65–99)
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
## 2017-09-23 LAB — GLUCOSE, CAPILLARY
GLUCOSE-CAPILLARY: 105 mg/dL — AB (ref 65–99)
GLUCOSE-CAPILLARY: 116 mg/dL — AB (ref 65–99)
GLUCOSE-CAPILLARY: 120 mg/dL — AB (ref 65–99)
GLUCOSE-CAPILLARY: 121 mg/dL — AB (ref 65–99)
Glucose-Capillary: 87 mg/dL (ref 65–99)
Glucose-Capillary: 112 mg/dL — ABNORMAL HIGH (ref 65–99)

## 2017-09-23 MED ORDER — PIVOT 1.5 CAL PO LIQD
1000.0000 mL | ORAL | Status: AC
  Administered 2017-09-23: 1000 mL
  Filled 2017-09-23: qty 1000

## 2017-09-23 MED ORDER — ENOXAPARIN SODIUM 40 MG/0.4ML ~~LOC~~ SOLN
40.0000 mg | SUBCUTANEOUS | Status: DC
  Administered 2017-09-24 – 2017-09-26 (×3): 40 mg via SUBCUTANEOUS
  Filled 2017-09-23 (×3): qty 0.4

## 2017-09-23 NOTE — Progress Notes (Signed)
Inpatient Rehabilitation  Following up after the family meeting end of last week. Note that patient remain more appropriate for SNF level of post acute rehab.  Will sign off at this time.  Call if questions.   Charlane Ferretti., CCC/SLP Admission Coordinator  [facility] Inpatient Rehabilitation  Cell [phone]

## 2017-09-23 NOTE — Progress Notes (Signed)
[facility] Progress Note     Subjective: CC: Timothy Potts with no overnight issues. Family present at bedside. Resting comfortably in bed.  Objective: Vital signs in last 24 hours: Temp:  [98.4 F (36.9 C)-98.9 F (37.2 C)] 98.4 F (36.9 C) (09/24 0824) Pulse Rate:  [103] 103 (09/24 0824) Resp:  [16-18] 16 (09/24 0824) BP: (116-121)/(77-91) 116/91 (09/24 0824) SpO2:  [95 %] 95 % (09/24 0824) Weight:  [64.7 kg (142 lb 10.2 oz)] 64.7 kg (142 lb 10.2 oz) (09/24 0400) Last BM Date: 09/22/17  Intake/Output from previous day: 09/23 0701 - 09/24 0700 In: 0  Out: 1350 [Urine:1350] Intake/Output this shift: No intake/output data recorded.  PE: Gen:  NAD HEENT: PEERL, collar in place Card:  Regular rate and rhythm, pedal pulses 2+ BL Pulm:  Normal effort, clear to auscultation bilaterally Abd: Soft, non-tender, non-distended, bowel sounds present, no HSM Neuro: opens eyes to voice, no verbal response, not following commands Skin: warm and dry, no rashes  Psych: A&Ox3   Lab Results:  No results for input(s): WBC, HGB, HCT, PLT in the last 72 hours. BMET  Recent Labs  09/21/17 0406  NA 139  K 4.1  CL 106  CO2 26  GLUCOSE 116*  BUN 23*  CREATININE 0.59*  CALCIUM 9.3   PT/INR No results for input(s): LABPROT, INR in the last 72 hours. CMP     Component Value Date/Time   NA 139 09/21/2017 0406   K 4.1 09/21/2017 0406   CL 106 09/21/2017 0406   CO2 26 09/21/2017 0406   GLUCOSE 116 (H) 09/21/2017 0406   BUN 23 (H) 09/21/2017 0406   CREATININE 0.59 (L) 09/21/2017 0406   CALCIUM 9.3 09/21/2017 0406   PROT 6.4 (L) 08/29/2017 0545   ALBUMIN 3.5 08/29/2017 0545   AST 50 (H) 08/29/2017 0545   ALT 20 08/29/2017 0545   ALKPHOS 60 08/29/2017 0545   BILITOT 1.2 08/29/2017 0545   GFRNONAA >60 09/21/2017 0406   GFRAA >60 09/21/2017 0406    Anti-infectives: Anti-infectives    Start     Dose/Rate Route Frequency Ordered Stop   09/08/17 1400  ceFEPIme  (MAXIPIME) 1 g in dextrose 5 % 50 mL IVPB     1 g 100 mL/hr over 30 Minutes Intravenous Every 8 hours 09/08/17 1253 09/15/17 2151   09/08/17 1400  vancomycin (VANCOCIN) IVPB 1000 mg/200 mL premix  Status:  Discontinued     1,000 mg 200 mL/hr over 60 Minutes Intravenous Every 8 hours 09/08/17 1254 09/10/17 0856   09/07/17 1400  piperacillin-tazobactam (ZOSYN) IVPB 3.375 g  Status:  Discontinued     3.375 g 12.5 mL/hr over 240 Minutes Intravenous Every 8 hours 09/07/17 1031 09/08/17 1244       Assessment/Plan  Timothy/DAI - intermittently F/C on L, will move Right sidein response to pain 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 Intermittently following commands and openingeyes to voice. R rib FX 5-7 - pulmonary toilet R orbit and max sinus FXs, nasal FX- Dr. Ulice Bold, no plan for OR currently; Potts may follow up in office when able Hx schizophrenia ABL anemia- follow, stable Acute urinary retention- Urecholine, I&O intermittent.   FEN- TF held at [facility] for possible PEG, continue to hold until we determine whether this can be done today or tomorrow ID- none currently VTE- PAS, Lovenox Foley: none, condom cath and intermittent I/O Follow up: Neuro, Plastics, trauma  Dispo- CIR has signed off, Potts will need  SNF placement. Potts will need PEG placement later today vs. Tomorrow.  LOS: 26 days    Wells Guiles , [facility] 09/23/2017, 9:55 AM Pager: [phone] Trauma Pager: [phone] Mon-Fri 7:00 am-4:30 pm Sat-Sun 7:00 am-11:30 am
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
## 2017-09-24 ENCOUNTER — Encounter (HOSPITAL_COMMUNITY): Admission: EM | Disposition: A | Discharge status: Rehabilitation Facility | Payer: Self-pay | Source: Home / Self Care

## 2017-09-24 ENCOUNTER — Inpatient Hospital Stay (HOSPITAL_COMMUNITY): Payer: Medicaid Other | Admitting: Anesthesiology

## 2017-09-24 ENCOUNTER — Encounter (HOSPITAL_COMMUNITY): Payer: Self-pay | Admitting: Certified Registered Nurse Anesthetist

## 2017-09-24 HISTORY — PX: PEG PLACEMENT: SHX5437

## 2017-09-24 HISTORY — PX: ESOPHAGOGASTRODUODENOSCOPY: SHX5428

## 2017-09-24 LAB — GLUCOSE, CAPILLARY
GLUCOSE-CAPILLARY: 94 mg/dL (ref 65–99)
Glucose-Capillary: 102 mg/dL — ABNORMAL HIGH (ref 65–99)
Glucose-Capillary: 116 mg/dL — ABNORMAL HIGH (ref 65–99)
Glucose-Capillary: 94 mg/dL (ref 65–99)

## 2017-09-24 SURGERY — EGD (ESOPHAGOGASTRODUODENOSCOPY)
Anesthesia: Monitor Anesthesia Care

## 2017-09-24 MED ORDER — PROPOFOL 500 MG/50ML IV EMUL
INTRAVENOUS | Status: DC | PRN
  Administered 2017-09-24: 50 ug/kg/min via INTRAVENOUS

## 2017-09-24 MED ORDER — SODIUM CHLORIDE 0.9 % IV SOLN
Freq: Once | INTRAVENOUS | Status: AC
  Administered 2017-09-24: 500 mL via INTRAVENOUS

## 2017-09-24 MED ORDER — PROPOFOL 10 MG/ML IV BOLUS
INTRAVENOUS | Status: DC | PRN
  Administered 2017-09-24: 20 mg via INTRAVENOUS

## 2017-09-24 MED ORDER — PIVOT 1.5 CAL PO LIQD
1000.0000 mL | ORAL | Status: DC
  Filled 2017-09-24: qty 1000

## 2017-09-24 MED ORDER — PIVOT 1.5 CAL PO LIQD
1000.0000 mL | ORAL | Status: DC
  Administered 2017-09-24 – 2017-09-26 (×3): 1000 mL
  Filled 2017-09-24 (×5): qty 1000

## 2017-09-24 MED ORDER — CEFAZOLIN SODIUM-DEXTROSE 2-4 GM/100ML-% IV SOLN
2.0000 g | Freq: Once | INTRAVENOUS | Status: AC
Start: 2017-09-24 — End: 2017-09-24
  Administered 2017-09-24: 2 g via INTRAVENOUS

## 2017-09-24 MED ORDER — LACTATED RINGERS IV SOLN
INTRAVENOUS | Status: DC | PRN
  Administered 2017-09-24: 10:00:00 via INTRAVENOUS

## 2017-09-24 MED ORDER — PRO-STAT SUGAR FREE PO LIQD
30.0000 mL | Freq: Two times a day (BID) | ORAL | Status: DC
  Administered 2017-09-24 – 2017-09-26 (×4): 30 mL
  Filled 2017-09-24 (×5): qty 30

## 2017-09-24 MED ORDER — CEFAZOLIN SODIUM-DEXTROSE 2-4 GM/100ML-% IV SOLN
INTRAVENOUS | Status: AC
  Filled 2017-09-24: qty 100
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
## 2017-09-24 NOTE — Progress Notes (Signed)
SLP Cancellation Note  Patient Details Name: Timothy Potts MRN: [id number] DOB: 12/10/1981   Cancelled treatment:       Currently receiving PEG. Continue efforts tomorrow.  Breck Coons [place].Ed CCC-SLP Pager [phone]     Royce Macadamia 09/24/2017, 9:30 AM
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
## 2017-09-24 NOTE — Anesthesia Postprocedure Evaluation (Signed)
Anesthesia Post Note  Patient: Timothy Potts  Procedure(s) Performed: Procedure(s) (LRB): ESOPHAGOGASTRODUODENOSCOPY (EGD) (N/A) PERCUTANEOUS ENDOSCOPIC GASTROSTOMY (PEG) PLACEMENT (N/A)     Patient location during evaluation: PACU Anesthesia Type: MAC Level of consciousness: awake and alert Pain management: pain level controlled Vital Signs Assessment: post-procedure vital signs reviewed and stable Respiratory status: spontaneous breathing, nonlabored ventilation, respiratory function stable and patient connected to nasal cannula oxygen Cardiovascular status: stable and blood pressure returned to baseline Postop Assessment: no apparent nausea or vomiting Anesthetic complications: no    Last Vitals:  Vitals:   09/24/17 1043 09/24/17 1110  BP: 119/78 104/87  Pulse: 81 89  Resp: (!) 24 14  Temp: 36.5 C 36.6 C  SpO2: 100% 99%    Last Pain:  Vitals:   09/24/17 1110  TempSrc: Oral  PainSc:                  Ryan P Ellender

## 2017-09-24 NOTE — Transfer of Care (Signed)
Immediate Anesthesia Transfer of Care Note  Patient: Timothy Potts  Procedure(s) Performed: Procedure(s): ESOPHAGOGASTRODUODENOSCOPY (EGD) (N/A) PERCUTANEOUS ENDOSCOPIC GASTROSTOMY (PEG) PLACEMENT (N/A)  Patient Location: PACU  Anesthesia Type:MAC  Level of Consciousness: awake  Airway & Oxygen Therapy: Patient Spontanous Breathing  Post-op Assessment: Report given to RN and Post -op Vital signs reviewed and stable  Post vital signs: Reviewed and stable  Last Vitals:  Vitals:   09/24/17 0931 09/24/17 1043  BP:  119/78  Pulse:  81  Resp:  (!) 24  Temp: 36.8 C 36.5 C  SpO2:  100%    Last Pain:  Vitals:   09/24/17 1043  TempSrc: Oral  PainSc: 0-No pain         Complications: No apparent anesthesia complications

## 2017-09-24 NOTE — Progress Notes (Signed)
PT Cancellation Note  Patient Details Name: Timothy Potts MRN: [id number] DOB: November 09, 1981   Cancelled Treatment:    Reason Eval/Treat Not Completed: Patient at procedure or test/unavailable. Will continue to follow and progress as able per POC.   Marylynn Pearson 09/24/2017, 9:43 AM   Conni Slipper, PT, DPT Acute Rehabilitation Services Pager: [phone]

## 2017-09-25 ENCOUNTER — Encounter (HOSPITAL_COMMUNITY): Payer: Self-pay | Admitting: General Surgery

## 2017-09-25 LAB — GLUCOSE, CAPILLARY
GLUCOSE-CAPILLARY: 120 mg/dL — AB (ref 65–99)
GLUCOSE-CAPILLARY: 111 mg/dL — AB (ref 65–99)
GLUCOSE-CAPILLARY: 120 mg/dL — AB (ref 65–99)
GLUCOSE-CAPILLARY: 106 mg/dL — AB (ref 65–99)
Glucose-Capillary: 104 mg/dL — ABNORMAL HIGH (ref 65–99)
Glucose-Capillary: 112 mg/dL — ABNORMAL HIGH (ref 65–99)
Glucose-Capillary: 120 mg/dL — ABNORMAL HIGH (ref 65–99)
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
## 2017-09-25 NOTE — Progress Notes (Signed)
[facility] Progress Note  1 Day Post-Op  Subjective: CC: TBI Patient resting comfortably in bed. Per RN had some agitation overnight, calmed down with some ativan.   Objective: Vital signs in last 24 hours: Temp:  [97.7 F (36.5 C)-99.7 F (37.6 C)] 98.8 F (37.1 C) (09/26 0400) Pulse Rate:  [81-114] 114 (09/26 0400) Resp:  [12-34] 20 (09/26 0400) BP: (104-128)/(59-93) 109/59 (09/26 0400) SpO2:  [92 %-100 %] 92 % (09/26 0400) Weight:  [77.4 kg (170 lb 9.6 oz)] 77.4 kg (170 lb 9.6 oz) (09/26 0400) Last BM Date: 09/23/17  Intake/Output from previous day: 09/25 0701 - 09/26 0700 In: 1000 [I.V.:1000] Out: 1652 [Urine:1650; Blood:2] Intake/Output this shift: No intake/output data recorded.  PE: Gen: NAD HEENT: PEERL, No spinous process tenderness or muscular tenderness, full PROM of neck without pain Card: Regular rate and rhythm, pedal pulses 2+ BL Pulm: Normal effort, clear to auscultation bilaterally Abd: Soft, non-tender, non-distended, bowel sounds present, no HSM Neuro: opens eyes to voice, no verbal response, not following commands Skin: warm and dry, no rashes  Psych: A&Ox3   Lab Results:  No results for input(s): WBC, HGB, HCT, PLT in the last 72 hours. BMET No results for input(s): NA, K, CL, CO2, GLUCOSE, BUN, CREATININE, CALCIUM in the last 72 hours. PT/INR No results for input(s): LABPROT, INR in the last 72 hours. CMP     Component Value Date/Time   NA 139 09/21/2017 0406   K 4.1 09/21/2017 0406   CL 106 09/21/2017 0406   CO2 26 09/21/2017 0406   GLUCOSE 116 (H) 09/21/2017 0406   BUN 23 (H) 09/21/2017 0406   CREATININE 0.59 (L) 09/21/2017 0406   CALCIUM 9.3 09/21/2017 0406   PROT 6.4 (L) 08/29/2017 0545   ALBUMIN 3.5 08/29/2017 0545   AST 50 (H) 08/29/2017 0545   ALT 20 08/29/2017 0545   ALKPHOS 60 08/29/2017 0545   BILITOT 1.2 08/29/2017 0545   GFRNONAA >60 09/21/2017 0406   GFRAA >60 09/21/2017 0406     Anti-infectives: Anti-infectives    Start     Dose/Rate Route Frequency Ordered Stop   09/24/17 1015  ceFAZolin (ANCEF) IVPB 2g/100 mL premix     2 g 200 mL/hr over 30 Minutes Intravenous  Once 09/24/17 1001 09/24/17 1015   09/08/17 1400  ceFEPIme (MAXIPIME) 1 g in dextrose 5 % 50 mL IVPB     1 g 100 mL/hr over 30 Minutes Intravenous Every 8 hours 09/08/17 1253 09/15/17 2151   09/08/17 1400  vancomycin (VANCOCIN) IVPB 1000 mg/200 mL premix  Status:  Discontinued     1,000 mg 200 mL/hr over 60 Minutes Intravenous Every 8 hours 09/08/17 1254 09/10/17 0856   09/07/17 1400  piperacillin-tazobactam (ZOSYN) IVPB 3.375 g  Status:  Discontinued     3.375 g 12.5 mL/hr over 240 Minutes Intravenous Every 8 hours 09/07/17 1031 09/08/17 1244       Assessment/Plan 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 Intermittently following commands and openingeyes to voice. R rib FX 5-7 - pulmonary toilet R orbit and max sinus FXs, nasal FX- Dr. Ulice Bold, no plan for OR currently; patient may follow up in office when able Hx schizophrenia ABL anemia- follow, stable Acute urinary retention- Urecholine, I&O intermittent.   FEN- TF, IVF ID- Ancef x1 for PEG  yesterday VTE- PAS, Lovenox Foley: none, condom cath and intermittent I/O Follow up: Neuro, Plastics, trauma  Dispo- SNF. C spine cleared clinically, CT C spine was negative on admission. PEG is functioning well.   LOS: 28 days    Wells Guiles , [facility] 09/25/2017, 8:00 AM Pager: [phone] Trauma Pager: [phone] Mon-Fri 7:00 am-4:30 pm Sat-Sun 7:00 am-11:30 am
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
## 2017-09-25 NOTE — Clinical Social Work Note (Signed)
Clinical Social Worker continuing to follow patient and family for support and discharge planning needs.  CSW spoke with patient father over the phone and provided bed offer to [facility] [place].  Patient father expressed concerns about location and distance, but is agreeable to facility.  CSW explained that patient will likely be ready for discharge tomorrow - patient father unhappy, however understanding.  Patient with mitten removed at 2:30pm today and facility requests that patient remain 24 hours with no mittens prior to discharge.  CSW remains available for support and to facilitate patient discharge needs.  Macario Golds, [place] [phone]
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
## 2017-09-26 LAB — GLUCOSE, CAPILLARY
GLUCOSE-CAPILLARY: 131 mg/dL — AB (ref 65–99)
Glucose-Capillary: 93 mg/dL (ref 65–99)
Glucose-Capillary: 123 mg/dL — ABNORMAL HIGH (ref 65–99)

## 2017-09-26 LAB — BASIC METABOLIC PANEL
ANION GAP: 8 (ref 5–15)
BUN: 21 mg/dL — ABNORMAL HIGH (ref 6–20)
CALCIUM: 9.5 mg/dL (ref 8.9–10.3)
CHLORIDE: 103 mmol/L (ref 101–111)
CO2: 27 mmol/L (ref 22–32)
Creatinine, Ser: 0.55 mg/dL — ABNORMAL LOW (ref 0.61–1.24)
GFR calc non Af Amer: >60 mL/min (ref >60)
GLUCOSE: 92 mg/dL (ref 65–99)
Potassium: 3.8 mmol/L (ref 3.5–5.1)
Sodium: 138 mmol/L (ref 135–145)

## 2017-09-26 MED ORDER — ENOXAPARIN SODIUM 40 MG/0.4ML ~~LOC~~ SOLN: 40.0000 mg | SUBCUTANEOUS | Status: DC

## 2017-09-26 MED ORDER — FREE WATER: 200.0000 mL | Freq: Three times a day (TID) | Status: AC

## 2017-09-26 MED ORDER — LEVETIRACETAM 100 MG/ML PO SOLN: 1500.0000 mg | Freq: Two times a day (BID) | ORAL | 12 refills | Status: AC

## 2017-09-26 MED ORDER — PRO-STAT SUGAR FREE PO LIQD: 30.0000 mL | Freq: Two times a day (BID) | ORAL | 0 refills | Status: AC

## 2017-09-26 MED ORDER — LAMOTRIGINE 150 MG PO TABS: 150.0000 mg | ORAL_TABLET | Freq: Two times a day (BID) | ORAL | Status: AC

## 2017-09-26 MED ORDER — IPRATROPIUM-ALBUTEROL 0.5-2.5 (3) MG/3ML IN SOLN: 3.0000 mL | Freq: Four times a day (QID) | RESPIRATORY_TRACT | Status: AC | PRN

## 2017-09-26 MED ORDER — FREE WATER
200.0000 mL | Freq: Three times a day (TID) | Status: DC
  Administered 2017-09-26: 200 mL

## 2017-09-26 MED ORDER — JEVITY 1.2 CAL PO LIQD: 1000.0000 mL | ORAL | 0 refills | Status: AC

## 2017-09-26 MED ORDER — JEVITY 1.2 CAL PO LIQD
1000.0000 mL | ORAL | Status: DC
  Filled 2017-09-26: qty 1000

## 2017-09-26 MED ORDER — INSULIN ASPART 100 UNIT/ML ~~LOC~~ SOLN: 0.0000 [IU] | SUBCUTANEOUS | 11 refills | Status: DC

## 2017-09-26 MED ORDER — BETHANECHOL CHLORIDE 25 MG PO TABS: 25.0000 mg | ORAL_TABLET | Freq: Three times a day (TID) | ORAL | Status: AC

## 2017-09-26 NOTE — Progress Notes (Signed)
[facility] Progress Note  2 Days Post-Op  Subjective: CC: TBI No family present at bedside this AM. Patient is resting comfortably. No mittens present.   Objective: Vital signs in last 24 hours: Temp:  [97.6 F (36.4 C)-98 F (36.7 C)] 97.6 F (36.4 C) (09/27 0348) Pulse Rate:  [89-107] 89 (09/27 0348) Resp:  [13-21] 13 (09/27 0348) BP: (115-129)/(81-89) 115/81 (09/27 0348) SpO2:  [93 %-97 %] 96 % (09/27 0348) Weight:  [77.1 kg (169 lb 14.4 oz)] 77.1 kg (169 lb 14.4 oz) (09/27 0348) Last BM Date: 09/23/17  Intake/Output from previous day: 09/26 0701 - 09/27 0700 In: 360 [P.O.:360] Out: 1450 [Urine:1450] Intake/Output this shift: No intake/output data recorded.  PE: Gen: NAD HEENT: PEERL Card: Regular rate and rhythm, pedal pulses 2+ BL Pulm: Normal effort, clear to auscultation bilaterally Abd: Soft, non-tender, non-distended, bowel sounds present, no HSM; PEG site c/d/i with no surrounding erythema or drainage Neuro: opens eyes to voice, no verbal response, not following commands for me Skin: warm and dry, no rashes  Psych: A&Ox3   Lab Results:  No results for input(s): WBC, HGB, HCT, PLT in the last 72 hours. BMET  Recent Labs  09/26/17 0507  NA 138  K 3.8  CL 103  CO2 27  GLUCOSE 92  BUN 21*  CREATININE 0.55*  CALCIUM 9.5   PT/INR No results for input(s): LABPROT, INR in the last 72 hours. CMP     Component Value Date/Time   NA 138 09/26/2017 0507   K 3.8 09/26/2017 0507   CL 103 09/26/2017 0507   CO2 27 09/26/2017 0507   GLUCOSE 92 09/26/2017 0507   BUN 21 (H) 09/26/2017 0507   CREATININE 0.55 (L) 09/26/2017 0507   CALCIUM 9.5 09/26/2017 0507   PROT 6.4 (L) 08/29/2017 0545   ALBUMIN 3.5 08/29/2017 0545   AST 50 (H) 08/29/2017 0545   ALT 20 08/29/2017 0545   ALKPHOS 60 08/29/2017 0545   BILITOT 1.2 08/29/2017 0545   GFRNONAA >60 09/26/2017 0507   GFRAA >60 09/26/2017 0507   Lipase  No results found for:  LIPASE     Studies/Results: No results found.  Anti-infectives: Anti-infectives    Start     Dose/Rate Route Frequency Ordered Stop   09/24/17 1015  ceFAZolin (ANCEF) IVPB 2g/100 mL premix     2 g 200 mL/hr over 30 Minutes Intravenous  Once 09/24/17 1001 09/24/17 1015   09/08/17 1400  ceFEPIme (MAXIPIME) 1 g in dextrose 5 % 50 mL IVPB     1 g 100 mL/hr over 30 Minutes Intravenous Every 8 hours 09/08/17 1253 09/15/17 2151   09/08/17 1400  vancomycin (VANCOCIN) IVPB 1000 mg/200 mL premix  Status:  Discontinued     1,000 mg 200 mL/hr over 60 Minutes Intravenous Every 8 hours 09/08/17 1254 09/10/17 0856   09/07/17 1400  piperacillin-tazobactam (ZOSYN) IVPB 3.375 g  Status:  Discontinued     3.375 g 12.5 mL/hr over 240 Minutes Intravenous Every 8 hours 09/07/17 1031 09/08/17 1244       Assessment/Plan 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 Intermittently following commands and openingeyes to voice. R rib FX 5-7 - pulmonary toilet R orbit and max sinus FXs, nasal FX- Dr. Ulice Bold, no plan for OR currently; patient may  follow up in office when able Hx schizophrenia ABL anemia- follow, stable Acute urinary retention- Urecholine, I&O intermittent.   FEN- TF, IVF ID- no current abx VTE- PAS, Lovenox Foley: none, condom cath and intermittent I/O Follow up: Neuro, Plastics  Dispo- SNF hopefully today  LOS: 29 days    Wells Guiles , [facility] 09/26/2017, 8:13 AM Pager: [phone] Trauma Pager: [phone] Mon-Fri 7:00 am-4:30 pm Sat-Sun 7:00 am-11:30 am

## 2017-09-26 NOTE — Discharge Instructions (Signed)
Supporting Someone With Traumatic Brain Injury °Traumatic brain injury (TBI) is an injury to the brain that results from a hard, direct hit to the head. It also results from whiplash or a direct blow to the head by an object. TBI can be mild, moderate, or severe. °What do I need to know about this condition? °Symptoms of any type of TBI can be long lasting (chronic). Depending on the area of the brain that is affected, a TBI can interfere with vision, memory, concentration, speech, balance, sense of touch, and sleep. TBI can also cause chronic symptoms like headache or dizziness. °A mild TBI is also known as a concussion. A concussion usually involves being knocked-out (losing consciousness) for a short time or not at all, and results in less brain damage. A moderate or severe TBI involves losing consciousness for an extended period of time. This can result in more serious damage to the brain, such as memory loss after the injury. °What do I need to know about my loved one's recovery? °Traumatic brain injuries affect different people in different ways. It is important to understand that recovery is different for each person, and may take time. °Mild injury °Many people with a mild brain injury recover quickly and return to their normal activities and abilities. However, some people may continue to experience problems associated with their brain injury. This can cause frustration, anger, and moodiness for the person and their loved ones, especially if they expected a normal and complete recovery. °Moderate or severe injury °After a moderate or severe injury, there is usually an initial recovery period that may last up to several weeks. During this time, your loved one may stay in the hospital or a rehabilitation center. Depending on your loved one's injury, age, recovery, and overall health, he or she may: °· Be allowed to return home or enter a long-term care facility. °· Need to continue rehabilitation with  occupational, physical, and speech therapists to help restore their mobility and their ability to process information, speak, and do daily activities. °· Have changes to mood and personality. °· Experience changes to sleep, energy levels, appetite, balance, bladder control, and vision. °· Have long-term health complications, such as seizures, headaches, or chronic pain. °· Need to take an extended time off work or school. Some people are not able to return to these activities after their injury. ° °How can I support my friend or family member? °General help °· Offer assistance with household chores or other daily tasks. Cook or make "freezer meals" that can be reheated. °· Help your loved one establish a daily routine. This may include setting reminders or having a shared day planner or calendar. °· Encourage rest. Your loved one may need frequent breaks during social situations or other activities. °· Be patient. Your loved one may take longer to complete tasks and process information. °· When giving instructions, give only one instruction at a time or make lists. Multitasking can be difficult for a person with a TBI. °· Do not set expectations about your loved one's recovery. °Medical appointments °· Gently remind your loved one of tasks or appointments if he or she is forgetful. °· Provide transportation to and from appointments. °· Attend rehabilitation appointments with your loved one. Help with exercises at home. °Preventing falls °Take steps to prevent falls in your loved one's home. This may include: °· Installing grab bars in the bathroom or handrails on stairs. °· Using night lights in the bedroom, bathroom, or hallways. °· Securing   or removing rugs and cords in walkways. ° °Managing finances °· Help manage finances. Talk with a social worker or legal professional if: °? Your loved one is unable to return to work and is in need of financial help. °? You need help paying medical bills. °? You need to  establish guardianship over finances. °? You need help with estate planning. °How can I care for myself? °Lifestyle °· Do not use drugs. °· Avoid or limit alcohol use. Limit alcohol intake to no more than 1 drink per day for nonpregnant women and 2 drinks per day for men. One drink equals 12 ounces of beer, 5 ounces of wine, or 1½ ounces of hard liquor. °· Rest. Try to get 7-9 hours of uninterrupted sleep each night. °· Eat a balanced diet that includes fresh fruits and vegetables, whole grains, lean proteins, and low-fat dairy. °· Exercise for at least 30 minutes on 5 or more days each week. °· Find ways to manage stress. This may include: °? Deep breathing, yoga, or meditation. °? Spending time outdoors. °? Journaling. °Finding support °· Ask for help. Take a break if you are the primary caregiver to your loved one. °· Spend time with supportive people. °· Join a support group with other caregivers or family members of people with TBI. °· If you experience new or worsening depression or anxiety, seek counseling from a mental health professional. °Where to find more information: °· Learn more about TBI and find support through: °? Brainline: www.brainline.org °? Brain Injury Association of America: www.biausa.org °· For military servicemen and their families: °? Defense and Veterans Brain Injury Center: dvbic.dcoe.mil °? Department of Veterans Affairs Military and Veterans Crisis Line: 1-800-273-8255 °Summary °· A Traumatic brain injury (TBI) is an injury to the brain that can interfere with vision, memory, concentration, speech, balance, sense of touch, and sleep. TBI can also cause chronic symptoms like headache or dizziness. °· Traumatic brain injuries affect different people in different ways. It is important to understand that recovery takes time and looks different for each person. °· You can support your loved one with a TBI by assisting with daily tasks, setting reminders, encouraging rest, and being  patient. °· It is important to take care of yourself as a caregiver. Take time to exercise, manage stress, spend time with supportive people, and rest. Ask for help, find a support group, and meet with a mental health counselor, if needed. °This information is not intended to replace advice given to you by your health care provider. Make sure you discuss any questions you have with your health care provider. °Document Released: 12/14/2016 Document Revised: 12/14/2016 Document Reviewed: 12/14/2016 °Elsevier Interactive Patient Education © 2018 Elsevier Inc. ° °

## 2017-09-26 NOTE — Progress Notes (Signed)
Clinical Social Worker facilitated patient discharge including contacting patient family and facility to confirm patient discharge plans.  Clinical information faxed to facility and family agreeable with plan.  CSW arranged ambulance transport via [facility] to [facility] .  RN Dois Davenport to call [phone] (ask for 400 hall nurse) for report prior to discharge.  Clinical Social Worker will sign off for now as social work intervention is no longer needed. Please consult [place] again if new need arises.  Marrianne Mood, MSW, [employer] [phone]

## 2017-09-26 NOTE — Progress Notes (Signed)
Family given discharge instructions, medication lists, follow up appointments, and when to call the doctor.  Family verbalizes understanding. Called report to RN MJ at  [facility] [phone].  All questions answered. This RN made receiving RN aware that patient to be transported via ambulance and may not leave this facility until 18:30 (two hour drive). Pt resting with call bell within reach.  Will continue to monitor. Thomas Hoff, RN
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
## 2017-09-27 ENCOUNTER — Encounter: Payer: Self-pay | Admitting: *Deleted

## 2017-11-01 ENCOUNTER — Encounter (INDEPENDENT_AMBULATORY_CARE_PROVIDER_SITE_OTHER): Payer: Self-pay

## 2017-12-02 ENCOUNTER — Encounter: Payer: Self-pay | Admitting: Neurology

## 2017-12-02 ENCOUNTER — Telehealth: Payer: Self-pay | Admitting: Neurology

## 2017-12-02 ENCOUNTER — Ambulatory Visit: Payer: Medicaid Other | Admitting: Neurology

## 2017-12-02 VITALS — BP 130/89 | HR 98

## 2017-12-02 DIAGNOSIS — S069X4S Unspecified intracranial injury with loss of consciousness of 6 hours to 24 hours, sequela: Secondary | ICD-10-CM

## 2017-12-02 DIAGNOSIS — G40219 Localization-related (focal) (partial) symptomatic epilepsy and epileptic syndromes with complex partial seizures, intractable, without status epilepticus: Secondary | ICD-10-CM

## 2017-12-02 DIAGNOSIS — R269 Unspecified abnormalities of gait and mobility: Secondary | ICD-10-CM

## 2017-12-02 DIAGNOSIS — Z5181 Encounter for therapeutic drug level monitoring: Secondary | ICD-10-CM

## 2017-12-02 HISTORY — DX: Unspecified abnormalities of gait and mobility: R26.9
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
## 2017-12-02 NOTE — Telephone Encounter (Signed)
Pt states he will go elsewhere for his labs because he did not want to wait for the lab tech to come back from lunch. Also he states that he will call back to schedule f/u if he needs it because he may be back to his reg. Dr by the f/u time. JBA
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
## 2018-08-14 IMAGING — DX DG CHEST 1V PORT
1 series · 1 of 1 positions shown · non-contrast
Comparison: Prior radiograph from 09/03/2017.

CLINICAL DATA: Initial evaluation for intubation.

EXAM:
PORTABLE CHEST 1 VIEW

[chest ap]
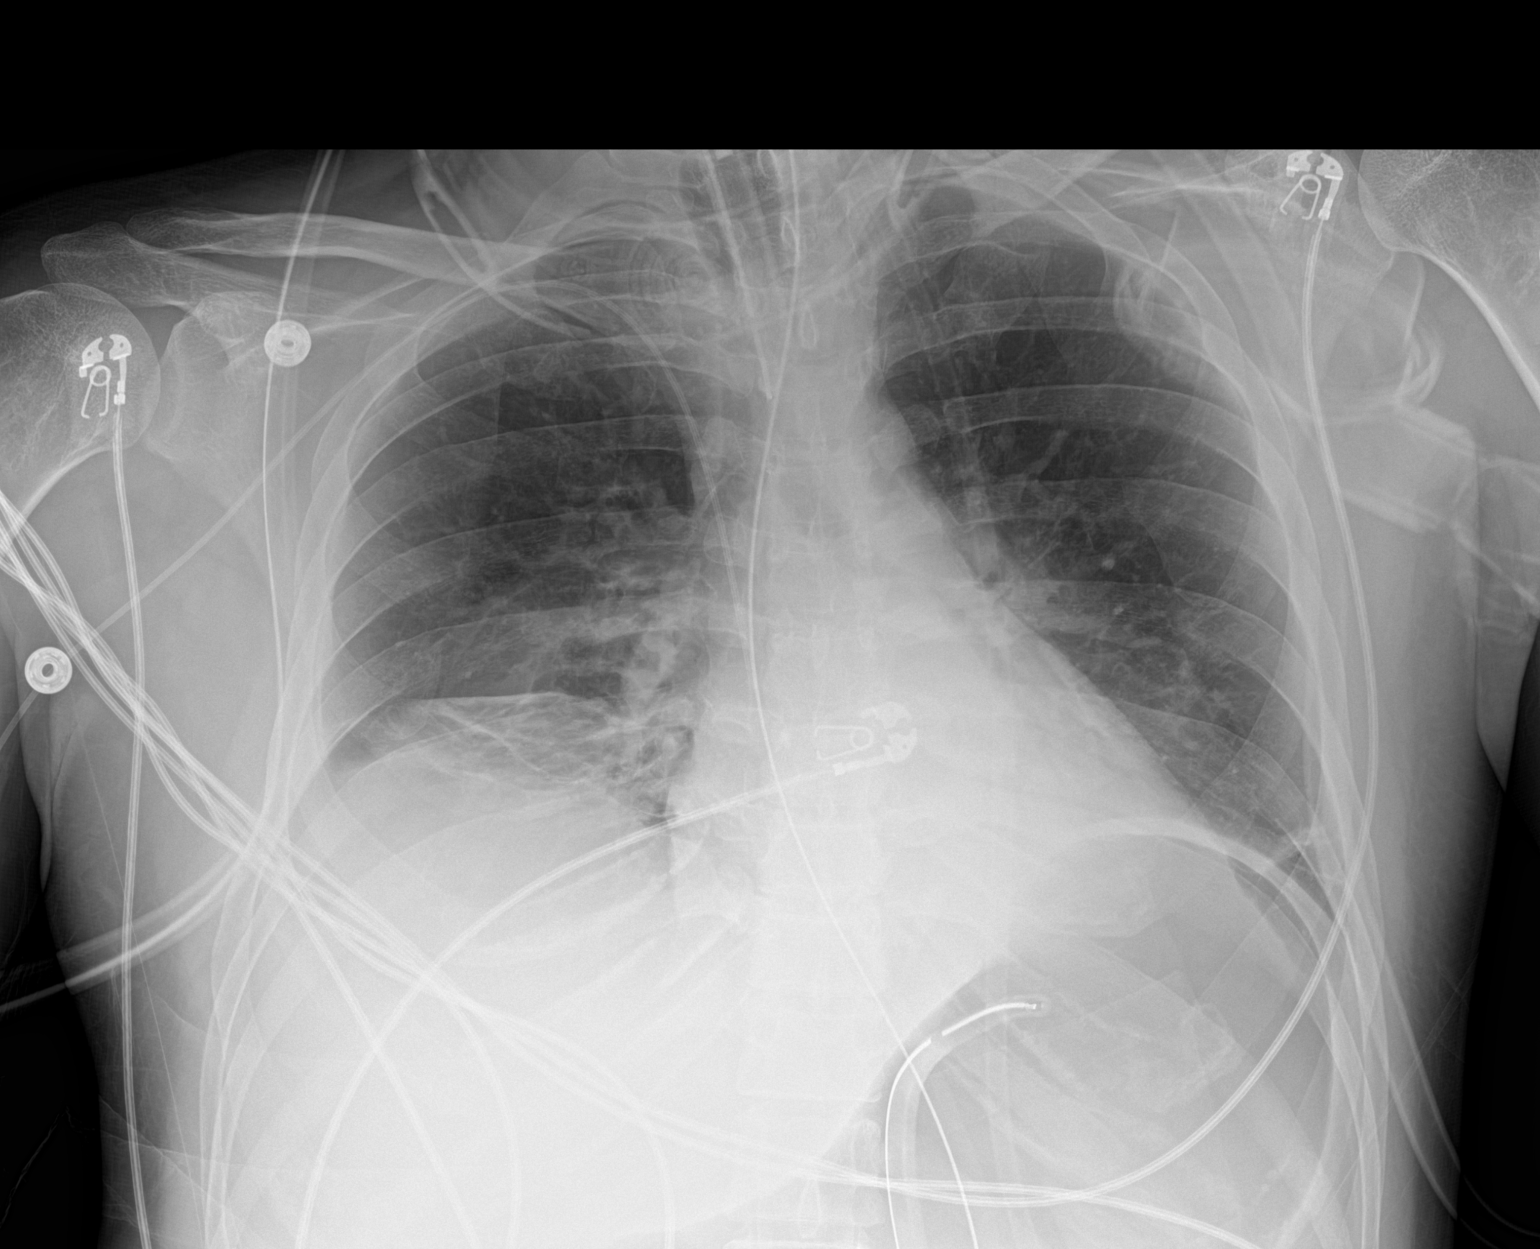

[1 of 1 positions shown; findings below may reference images not displayed]

FINDINGS: Endotracheal tube in place with tip well positioned 2.1 cm above the
carina. Enteric tube courses in the the abdomen. Right PICC catheter
in place with tip overlying the cavoatrial junction.

Lungs are hypoinflated. Increased right basilar atelectasis as
compared to previous. Small right pleural effusion. Mild left
basilar atelectatic changes noted as well. No other focal
infiltrates. No pulmonary edema. No pneumothorax.

Osseous structures unchanged.
IMPRESSION: 1. Tip of the endotracheal tube well positioned 2.1 cm above the
carina.
2. Shallow lung inflation with increased right basilar atelectasis.
3. Small right pleural effusion.

## 2018-09-10 ENCOUNTER — Ambulatory Visit: Payer: Medicaid Other | Admitting: Physical Therapy

## 2018-09-29 ENCOUNTER — Other Ambulatory Visit: Payer: Self-pay

## 2018-09-29 ENCOUNTER — Ambulatory Visit: Payer: Medicaid Other | Attending: Family Medicine | Admitting: Physical Therapy

## 2018-09-29 ENCOUNTER — Ambulatory Visit: Payer: Medicaid Other | Admitting: Physical Therapy

## 2018-09-29 ENCOUNTER — Encounter: Payer: Self-pay | Admitting: Physical Therapy

## 2018-09-29 DIAGNOSIS — M6281 Muscle weakness (generalized): Secondary | ICD-10-CM | POA: Diagnosis present

## 2018-09-29 DIAGNOSIS — R2689 Other abnormalities of gait and mobility: Secondary | ICD-10-CM | POA: Diagnosis present

## 2018-09-29 DIAGNOSIS — R2681 Unsteadiness on feet: Secondary | ICD-10-CM
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
## 2018-10-01 ENCOUNTER — Ambulatory Visit: Payer: Medicaid Other | Admitting: Physical Therapy

## 2018-10-02 ENCOUNTER — Ambulatory Visit: Payer: Medicaid Other | Admitting: Physical Therapy

## 2018-10-06 ENCOUNTER — Ambulatory Visit: Payer: Medicaid Other | Admitting: Physical Therapy

## 2018-10-06 ENCOUNTER — Encounter: Payer: Self-pay | Admitting: Physical Therapy

## 2018-10-06 ENCOUNTER — Ambulatory Visit: Payer: Medicaid Other | Attending: Family Medicine | Admitting: Physical Therapy

## 2018-10-06 DIAGNOSIS — R2681 Unsteadiness on feet: Secondary | ICD-10-CM | POA: Insufficient documentation

## 2018-10-06 DIAGNOSIS — M6281 Muscle weakness (generalized): Secondary | ICD-10-CM | POA: Diagnosis present

## 2018-10-06 DIAGNOSIS — R2689 Other abnormalities of gait and mobility: Secondary | ICD-10-CM | POA: Diagnosis not present
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
## 2018-10-06 NOTE — Patient Instructions (Addendum)
FLEXION: Sitting - Resistance Band (Active)    Sit, both feet flat. Against yellow resistance band, lift right knee toward ceiling. Complete 2 sets of 15 repetitions. Perform 2 sessions per day.  http://gtsc.exer.us/20   Copyright  VHI. All rights reserved.  FLEXION: Sitting - Resistance Band (Active)    Sit, both feet flat. Against yellow resistance band, lift right knee toward ceiling. Complete 2 sets of 15 repetitions. Perform 2 sessions per day.  http://gtsc.exer.us/20   Copyright  VHI. All rights reserved.

## 2018-10-08 ENCOUNTER — Ambulatory Visit: Payer: Medicaid Other | Admitting: Physical Therapy

## 2018-10-13 ENCOUNTER — Ambulatory Visit: Payer: Medicaid Other | Admitting: Physical Therapy

## 2018-10-14 NOTE — Addendum Note (Signed)
Addended by: Zettie Pho E on: 10/14/2018 08:26 AM   Modules accepted: Orders

## 2018-10-15 ENCOUNTER — Ambulatory Visit: Payer: Medicaid Other | Admitting: Physical Therapy

## 2018-10-17 ENCOUNTER — Ambulatory Visit: Payer: Medicaid Other

## 2018-10-17 DIAGNOSIS — M6281 Muscle weakness (generalized): Secondary | ICD-10-CM

## 2018-10-17 DIAGNOSIS — R2689 Other abnormalities of gait and mobility: Secondary | ICD-10-CM | POA: Diagnosis not present

## 2018-10-17 DIAGNOSIS — R2681 Unsteadiness on feet: Secondary | ICD-10-CM
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
## 2018-10-20 ENCOUNTER — Ambulatory Visit: Payer: Medicaid Other | Admitting: Physical Therapy

## 2018-10-20 ENCOUNTER — Encounter: Payer: Self-pay | Admitting: Physical Therapy

## 2018-10-20 DIAGNOSIS — M6281 Muscle weakness (generalized): Secondary | ICD-10-CM

## 2018-10-20 DIAGNOSIS — R2689 Other abnormalities of gait and mobility: Secondary | ICD-10-CM

## 2018-10-20 DIAGNOSIS — R2681 Unsteadiness on feet: Secondary | ICD-10-CM
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
## 2018-10-22 ENCOUNTER — Ambulatory Visit: Payer: Self-pay | Admitting: Physical Therapy

## 2018-10-27 ENCOUNTER — Ambulatory Visit: Payer: Medicaid Other | Admitting: Physical Therapy

## 2018-10-27 ENCOUNTER — Encounter: Payer: Self-pay | Admitting: Physical Therapy

## 2018-10-27 DIAGNOSIS — R2681 Unsteadiness on feet: Secondary | ICD-10-CM

## 2018-10-27 DIAGNOSIS — M6281 Muscle weakness (generalized): Secondary | ICD-10-CM

## 2018-10-27 DIAGNOSIS — R2689 Other abnormalities of gait and mobility: Secondary | ICD-10-CM | POA: Diagnosis not present
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
## 2018-10-29 ENCOUNTER — Encounter: Payer: Self-pay | Admitting: Physical Therapy

## 2018-10-29 ENCOUNTER — Ambulatory Visit: Payer: Medicaid Other | Admitting: Physical Therapy

## 2018-10-29 DIAGNOSIS — M6281 Muscle weakness (generalized): Secondary | ICD-10-CM

## 2018-10-29 DIAGNOSIS — R2689 Other abnormalities of gait and mobility: Secondary | ICD-10-CM | POA: Diagnosis not present

## 2018-10-29 DIAGNOSIS — R2681 Unsteadiness on feet: Secondary | ICD-10-CM
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
## 2018-11-03 ENCOUNTER — Encounter: Payer: Self-pay | Admitting: Physical Therapy

## 2018-11-03 ENCOUNTER — Ambulatory Visit: Payer: Medicaid Other | Attending: Family Medicine | Admitting: Physical Therapy

## 2018-11-03 DIAGNOSIS — R278 Other lack of coordination: Secondary | ICD-10-CM | POA: Insufficient documentation

## 2018-11-03 DIAGNOSIS — R2689 Other abnormalities of gait and mobility: Secondary | ICD-10-CM | POA: Diagnosis present

## 2018-11-03 DIAGNOSIS — M6281 Muscle weakness (generalized): Secondary | ICD-10-CM | POA: Insufficient documentation

## 2018-11-03 DIAGNOSIS — R2681 Unsteadiness on feet: Secondary | ICD-10-CM | POA: Insufficient documentation
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
## 2018-11-04 ENCOUNTER — Ambulatory Visit: Payer: Medicaid Other | Admitting: Physical Therapy

## 2018-11-05 ENCOUNTER — Ambulatory Visit: Payer: Medicaid Other | Admitting: Occupational Therapy

## 2018-11-05 ENCOUNTER — Other Ambulatory Visit: Payer: Self-pay

## 2018-11-05 DIAGNOSIS — R2689 Other abnormalities of gait and mobility: Secondary | ICD-10-CM | POA: Diagnosis not present

## 2018-11-05 DIAGNOSIS — M6281 Muscle weakness (generalized): Secondary | ICD-10-CM

## 2018-11-05 NOTE — Addendum Note (Signed)
Addended by: Avon Gully on: 11/05/2018 11:55 AM   Modules accepted: Orders
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
## 2018-11-10 ENCOUNTER — Ambulatory Visit: Payer: Medicaid Other | Admitting: Occupational Therapy

## 2018-11-10 ENCOUNTER — Encounter: Payer: Self-pay | Admitting: Occupational Therapy

## 2018-11-10 DIAGNOSIS — M6281 Muscle weakness (generalized): Secondary | ICD-10-CM

## 2018-11-10 DIAGNOSIS — R2689 Other abnormalities of gait and mobility: Secondary | ICD-10-CM | POA: Diagnosis not present
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
## 2018-11-11 ENCOUNTER — Ambulatory Visit: Payer: Medicaid Other | Admitting: Physical Therapy

## 2018-11-12 ENCOUNTER — Ambulatory Visit: Payer: Medicaid Other | Admitting: Occupational Therapy

## 2018-11-12 ENCOUNTER — Encounter: Payer: Self-pay | Admitting: Occupational Therapy

## 2018-11-12 DIAGNOSIS — R2689 Other abnormalities of gait and mobility: Secondary | ICD-10-CM | POA: Diagnosis not present

## 2018-11-12 DIAGNOSIS — M6281 Muscle weakness (generalized): Secondary | ICD-10-CM
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
## 2018-11-17 ENCOUNTER — Ambulatory Visit: Payer: Medicaid Other | Admitting: Occupational Therapy

## 2018-11-17 ENCOUNTER — Encounter: Payer: Self-pay | Admitting: Occupational Therapy

## 2018-11-17 DIAGNOSIS — M6281 Muscle weakness (generalized): Secondary | ICD-10-CM

## 2018-11-17 DIAGNOSIS — R2689 Other abnormalities of gait and mobility: Secondary | ICD-10-CM | POA: Diagnosis not present

## 2018-11-17 DIAGNOSIS — R278 Other lack of coordination: Secondary | ICD-10-CM
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
## 2018-11-18 ENCOUNTER — Ambulatory Visit: Payer: Medicaid Other | Admitting: Physical Therapy

## 2018-11-19 ENCOUNTER — Encounter: Payer: Medicaid Other | Admitting: Occupational Therapy

## 2018-11-19 ENCOUNTER — Ambulatory Visit: Payer: Medicaid Other | Admitting: Physical Therapy

## 2018-11-24 ENCOUNTER — Ambulatory Visit: Payer: Medicaid Other | Admitting: Occupational Therapy

## 2018-11-24 ENCOUNTER — Encounter: Payer: Self-pay | Admitting: Occupational Therapy

## 2018-11-24 DIAGNOSIS — R278 Other lack of coordination: Secondary | ICD-10-CM

## 2018-11-24 DIAGNOSIS — R2689 Other abnormalities of gait and mobility: Secondary | ICD-10-CM | POA: Diagnosis not present

## 2018-11-24 DIAGNOSIS — M6281 Muscle weakness (generalized): Secondary | ICD-10-CM
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
## 2018-11-26 ENCOUNTER — Ambulatory Visit: Payer: Medicaid Other | Admitting: Occupational Therapy

## 2018-11-26 ENCOUNTER — Ambulatory Visit: Payer: Medicaid Other | Admitting: Physical Therapy

## 2018-12-01 ENCOUNTER — Encounter: Payer: Self-pay | Admitting: Physical Therapy

## 2018-12-01 ENCOUNTER — Encounter: Payer: Self-pay | Admitting: Occupational Therapy

## 2018-12-01 ENCOUNTER — Ambulatory Visit: Payer: Medicaid Other | Admitting: Physical Therapy

## 2018-12-01 ENCOUNTER — Ambulatory Visit: Payer: Medicaid Other | Attending: Family Medicine | Admitting: Occupational Therapy

## 2018-12-01 DIAGNOSIS — R2689 Other abnormalities of gait and mobility: Secondary | ICD-10-CM

## 2018-12-01 DIAGNOSIS — R278 Other lack of coordination: Secondary | ICD-10-CM

## 2018-12-01 DIAGNOSIS — M6281 Muscle weakness (generalized): Secondary | ICD-10-CM | POA: Insufficient documentation

## 2018-12-01 DIAGNOSIS — R2681 Unsteadiness on feet: Secondary | ICD-10-CM | POA: Diagnosis present
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
## 2018-12-03 ENCOUNTER — Ambulatory Visit: Payer: Medicaid Other | Admitting: Occupational Therapy

## 2018-12-03 ENCOUNTER — Encounter: Payer: Self-pay | Admitting: Occupational Therapy

## 2018-12-03 ENCOUNTER — Ambulatory Visit: Payer: Medicaid Other | Admitting: Physical Therapy

## 2018-12-03 DIAGNOSIS — R278 Other lack of coordination: Secondary | ICD-10-CM

## 2018-12-03 DIAGNOSIS — M6281 Muscle weakness (generalized): Secondary | ICD-10-CM
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
## 2018-12-09 ENCOUNTER — Encounter: Payer: Medicaid Other | Admitting: Occupational Therapy

## 2018-12-10 ENCOUNTER — Encounter: Payer: Medicaid Other | Admitting: Occupational Therapy

## 2018-12-10 ENCOUNTER — Encounter: Payer: Self-pay | Admitting: Physical Therapy

## 2018-12-10 ENCOUNTER — Ambulatory Visit: Payer: Medicaid Other | Admitting: Physical Therapy

## 2018-12-10 DIAGNOSIS — R2689 Other abnormalities of gait and mobility: Secondary | ICD-10-CM

## 2018-12-10 DIAGNOSIS — M6281 Muscle weakness (generalized): Secondary | ICD-10-CM | POA: Diagnosis not present

## 2018-12-10 DIAGNOSIS — R2681 Unsteadiness on feet: Secondary | ICD-10-CM

## 2018-12-10 DIAGNOSIS — R278 Other lack of coordination: Secondary | ICD-10-CM

## 2018-12-10 NOTE — Patient Instructions (Signed)
   Copyright  VHI. All rights reserved.  HIP / KNEE: Extension - Sit to Stand   Sitting, lean chest forward, raise hips up from surface. Straighten hips and knees. Weight bear equally on left and right sides. Backs of legs should not push off surface. __10_ reps per set, __2_ sets per day, _5__ days per week Use assistive device as needed.  Be sure to use a stable chair that doesn't move; you could also use the couch or bed as that is heavy furniture that should be stable.
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
## 2018-12-11 ENCOUNTER — Ambulatory Visit: Payer: Medicaid Other | Admitting: Physical Therapy

## 2018-12-11 ENCOUNTER — Encounter: Payer: Medicaid Other | Admitting: Occupational Therapy

## 2018-12-16 ENCOUNTER — Encounter: Payer: Self-pay | Admitting: Physical Therapy

## 2018-12-16 ENCOUNTER — Ambulatory Visit: Payer: Medicaid Other | Admitting: Physical Therapy

## 2018-12-16 DIAGNOSIS — R2681 Unsteadiness on feet: Secondary | ICD-10-CM

## 2018-12-16 DIAGNOSIS — M6281 Muscle weakness (generalized): Secondary | ICD-10-CM | POA: Diagnosis not present

## 2018-12-16 DIAGNOSIS — R278 Other lack of coordination: Secondary | ICD-10-CM

## 2018-12-16 DIAGNOSIS — R2689 Other abnormalities of gait and mobility: Secondary | ICD-10-CM
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
## 2018-12-18 ENCOUNTER — Ambulatory Visit: Payer: Medicaid Other | Admitting: Physical Therapy

## 2018-12-23 ENCOUNTER — Ambulatory Visit: Payer: Medicaid Other | Admitting: Physical Therapy

## 2018-12-25 ENCOUNTER — Ambulatory Visit: Payer: Medicaid Other | Admitting: Physical Therapy

## 2019-01-23 ENCOUNTER — Ambulatory Visit: Payer: Medicaid Other | Attending: Neurology

## 2019-01-23 DIAGNOSIS — R400 Somnolence: Secondary | ICD-10-CM | POA: Insufficient documentation

## 2019-01-23 DIAGNOSIS — R0683 Snoring: Secondary | ICD-10-CM | POA: Diagnosis not present

## 2019-01-23 DIAGNOSIS — G4730 Sleep apnea, unspecified: Secondary | ICD-10-CM | POA: Insufficient documentation

## 2019-08-18 ENCOUNTER — Other Ambulatory Visit: Payer: Self-pay

## 2019-08-18 ENCOUNTER — Other Ambulatory Visit
Admission: RE | Admit: 2019-08-18 | Discharge: 2019-08-18 | Disposition: A | Discharge status: Home / Self Care | Payer: Medicaid Other | Source: Ambulatory Visit | Attending: Family Medicine | Admitting: Family Medicine

## 2019-08-18 DIAGNOSIS — Z01812 Encounter for preprocedural laboratory examination: Secondary | ICD-10-CM | POA: Diagnosis not present

## 2019-08-18 DIAGNOSIS — Z20828 Contact with and (suspected) exposure to other viral communicable diseases: Secondary | ICD-10-CM | POA: Diagnosis not present

## 2019-08-19 LAB — SARS CORONAVIRUS 2 (TAT 6-24 HRS): SARS Coronavirus 2: NEGATIVE

## 2019-08-21 ENCOUNTER — Ambulatory Visit: Payer: Medicaid Other | Attending: Neurology

## 2019-08-21 DIAGNOSIS — F5104 Psychophysiologic insomnia: Secondary | ICD-10-CM | POA: Insufficient documentation

## 2019-08-21 DIAGNOSIS — G4733 Obstructive sleep apnea (adult) (pediatric): Secondary | ICD-10-CM | POA: Insufficient documentation

## 2019-08-22 ENCOUNTER — Other Ambulatory Visit: Payer: Self-pay

## 2020-10-17 ENCOUNTER — Ambulatory Visit: Payer: Medicaid Other | Attending: Internal Medicine

## 2020-10-17 DIAGNOSIS — Z23 Encounter for immunization: Secondary | ICD-10-CM

## 2020-10-17 NOTE — Progress Notes (Signed)
   Covid-19 Vaccination Clinic  Name:  Timothy Potts    MRN: [id number] DOB: 08/19/1981  10/17/2020  [name] was observed post Covid-19 immunization for 15 minutes without incident. He was provided with Vaccine Information Sheet and instruction to access the V-Safe system.   [name] was instructed to call 911 with any severe reactions post vaccine: Marland Kitchen Difficulty breathing  . Swelling of face and throat  . A fast heartbeat  . A bad rash all over body  . Dizziness and weakness

## 2022-08-13 ENCOUNTER — Other Ambulatory Visit: Payer: Self-pay | Admitting: Student

## 2022-08-13 DIAGNOSIS — G40209 Localization-related (focal) (partial) symptomatic epilepsy and epileptic syndromes with complex partial seizures, not intractable, without status epilepticus: Secondary | ICD-10-CM

## 2022-08-27 ENCOUNTER — Ambulatory Visit
Admission: RE | Admit: 2022-08-27 | Discharge: 2022-08-27 | Disposition: A | Discharge status: Home / Self Care | Payer: Medicaid Other | Source: Ambulatory Visit | Attending: Student | Admitting: Student

## 2022-08-27 DIAGNOSIS — G40209 Localization-related (focal) (partial) symptomatic epilepsy and epileptic syndromes with complex partial seizures, not intractable, without status epilepticus: Secondary | ICD-10-CM

## 2023-02-15 ENCOUNTER — Emergency Department: Payer: Medicaid Other

## 2023-02-15 ENCOUNTER — Emergency Department
Admission: EM | Admit: 2023-02-15 | Discharge: 2023-02-15 | Disposition: A | Discharge status: Home / Self Care | Payer: Medicaid Other | Attending: Emergency Medicine | Admitting: Emergency Medicine

## 2023-02-15 DIAGNOSIS — G40909 Epilepsy, unspecified, not intractable, without status epilepticus: Secondary | ICD-10-CM

## 2023-02-15 DIAGNOSIS — R569 Unspecified convulsions: Secondary | ICD-10-CM | POA: Diagnosis present

## 2023-02-15 LAB — CBC WITH DIFFERENTIAL/PLATELET
Abs Immature Granulocytes: 0.06 10*3/uL (ref 0.00–0.07)
Basophils Absolute: 0 10*3/uL (ref 0.0–0.1)
Basophils Relative: 0 %
Eosinophils Absolute: 0.2 10*3/uL (ref 0.0–0.5)
Eosinophils Relative: 2 %
HCT: 36.9 % — ABNORMAL LOW (ref 39.0–52.0)
Hemoglobin: 12 g/dL — ABNORMAL LOW (ref 13.0–17.0)
Immature Granulocytes: 1 %
Lymphocytes Relative: 16 %
Lymphs Abs: 1.7 10*3/uL (ref 0.7–4.0)
MCH: 29.2 pg (ref 26.0–34.0)
MCHC: 32.5 g/dL (ref 30.0–36.0)
MCV: 89.8 fL (ref 80.0–100.0)
Monocytes Absolute: 0.6 10*3/uL (ref 0.1–1.0)
Monocytes Relative: 6 %
Neutro Abs: 7.7 10*3/uL (ref 1.7–7.7)
Neutrophils Relative %: 75 %
Platelets: 182 10*3/uL (ref 150–400)
RBC: 4.11 MIL/uL — ABNORMAL LOW (ref 4.22–5.81)
RDW: 13.6 % (ref 11.5–15.5)
WBC: 10.3 10*3/uL (ref 4.0–10.5)
nRBC: 0 % (ref 0.0–0.2)

## 2023-02-15 LAB — BASIC METABOLIC PANEL
Anion gap: 12 (ref 5–15)
BUN: 10 mg/dL (ref 6–20)
CO2: 25 mmol/L (ref 22–32)
Calcium: 9.5 mg/dL (ref 8.9–10.3)
Chloride: 101 mmol/L (ref 98–111)
Creatinine, Ser: 0.9 mg/dL (ref 0.61–1.24)
GFR, Estimated: >60 mL/min (ref >60)
Glucose, Bld: 120 mg/dL — ABNORMAL HIGH (ref 70–99)
Potassium: 3.4 mmol/L — ABNORMAL LOW (ref 3.5–5.1)
Sodium: 138 mmol/L (ref 135–145)

## 2023-02-15 LAB — URINALYSIS, ROUTINE W REFLEX MICROSCOPIC
Bilirubin Urine: NEGATIVE
Glucose, UA: NEGATIVE mg/dL
Hgb urine dipstick: NEGATIVE
Ketones, ur: NEGATIVE mg/dL
Leukocytes,Ua: NEGATIVE
Nitrite: NEGATIVE
Protein, ur: NEGATIVE mg/dL
Specific Gravity, Urine: 1.005 (ref 1.005–1.030)
pH: 7 (ref 5.0–8.0)

## 2023-02-15 NOTE — ED Provider Notes (Signed)
CT scan is reassuring, patient has remained at his normal baseline mental status during my evaluations.  He feels well and was ready to be discharged.  Does appear to be consistent with seizure given loss of bladder continence   Lavonia Drafts, MD 02/15/23 [phone]

## 2023-02-15 NOTE — ED Provider Notes (Signed)
[facility] Provider Note    Event Date/Time   First MD Initiated Contact with Patient 02/15/23 1426     (approximate)   History   Chief Complaint Seizures   HPI  Timothy Potts is a 42 y.o. male with past medical history of TBI and seizures who presents to the ED for altered mental status.  Per EMS, patient had taken a nap prior to lunch, when he woke up he was found to have defecated on himself and seemed "out of it" per staff at his assisted living facility.  They were concerned he might have a facial droop, however EMS did not note any facial droop on arrival.  Patient is awake and alert on arrival to the ED, currently denies any complaints.  He has not noticed any facial droop and denies any vision changes, speech changes, numbness or weakness in his extremities.  He states he has been compliant with his seizure medications.     Physical Exam   Triage Vital Signs: ED Triage Vitals  Enc Vitals Group     BP      Pulse      Resp      Temp      Temp src      SpO2      Weight      Height      Head Circumference      Peak Flow      Pain Score      Pain Loc      Pain Edu?      Excl. in [place]?     Most recent vital signs: Vitals:   02/15/23 1428  BP: (!) 137/91  Pulse: 96  Resp: 18  Temp: 98.3 F (36.8 C)  SpO2: 97%    Constitutional: Alert and oriented. Eyes: Conjunctivae are normal. Head: Atraumatic. Nose: No congestion/rhinnorhea. Mouth/Throat: Mucous membranes are moist.  Cardiovascular: Normal rate, regular rhythm. Grossly normal heart sounds.  2+ radial pulses bilaterally. Respiratory: Normal respiratory effort.  No retractions. Lungs CTAB. Gastrointestinal: Soft and nontender. No distention. Musculoskeletal: No lower extremity tenderness nor edema.  Neurologic:  Normal speech and language. No gross focal neurologic deficits are appreciated.    ED Results / Procedures / Treatments   Labs (all labs ordered are listed, but  only abnormal results are displayed) Labs Reviewed  CBC WITH DIFFERENTIAL/PLATELET - Abnormal; Notable for the following components:      Result Value   RBC 4.11 (*)    Hemoglobin 12.0 (*)    HCT 36.9 (*)    All other components within normal limits  BASIC METABOLIC PANEL - Abnormal; Notable for the following components:   Potassium 3.4 (*)    Glucose, Bld 120 (*)    All other components within normal limits  URINALYSIS, ROUTINE W REFLEX MICROSCOPIC     EKG  ED ECG REPORT I, Blake Divine, the attending physician, personally viewed and interpreted this ECG.   Date: 02/15/2023  EKG Time: 14:44  Rate: 97  Rhythm: normal sinus rhythm  Axis: Normal  Intervals:none  ST&T Change: None  RADIOLOGY CT head reviewed and interpreted by me with no hemorrhage or midline shift.  PROCEDURES:  Critical Care performed: No  Procedures   MEDICATIONS ORDERED IN ED: Medications - No data to display   IMPRESSION / MDM / [place] / ED COURSE  I reviewed the triage vital signs and the nursing notes.  42 y.o. male with past medical history of TBI and seizures who presents to the ED for episode of confusion after defecating on himself.  Patient's presentation is most consistent with acute presentation with potential threat to life or bodily function.  Differential diagnosis includes, but is not limited to, seizure, stroke, intracranial hemorrhage, electrolyte abnormality, arrhythmia.  Patient well-appearing and in no acute distress on arrival to the ED, vital signs are unremarkable and he has no focal neurologic deficits on exam.  He is alert and oriented x 4 and denies any complaints.  While there was reported concern for facial droop, there is absolutely no facial droop noted on my assessment.  We will check CT head, but low suspicion for stroke at this time, would favor unwitnessed seizure episode at his assisted living facility.  EKG shows no  evidence of arrhythmia or ischemia and I doubt cardiac etiology.  We will screen labs and observe here in the ED, but if workup is unremarkable he would be appropriate for discharge home with neurology follow-up.  Patient turned Potts to oncoming divider pending labs and CT results.      FINAL CLINICAL IMPRESSION(S) / ED DIAGNOSES   Final diagnoses:  Seizure disorder ([place])     Rx / DC Orders   ED Discharge Orders     None        Note:  This document was prepared using Dragon voice recognition software and may include unintentional dictation errors.   Blake Divine, MD 02/15/23 1524

## 2023-05-09 ENCOUNTER — Emergency Department: Payer: Medicaid Other

## 2023-05-09 ENCOUNTER — Emergency Department
Admission: EM | Admit: 2023-05-09 | Discharge: 2023-05-09 | Disposition: A | Discharge status: Home / Self Care | Payer: Medicaid Other | Attending: Emergency Medicine | Admitting: Emergency Medicine

## 2023-05-09 ENCOUNTER — Other Ambulatory Visit: Payer: Self-pay

## 2023-05-09 ENCOUNTER — Encounter: Payer: Self-pay | Admitting: Emergency Medicine

## 2023-05-09 DIAGNOSIS — R569 Unspecified convulsions: Secondary | ICD-10-CM | POA: Diagnosis present

## 2023-05-09 LAB — COMPREHENSIVE METABOLIC PANEL
ALT: 24 U/L (ref 0–44)
AST: 24 U/L (ref 15–41)
Albumin: 5 g/dL (ref 3.5–5.0)
Alkaline Phosphatase: 78 U/L (ref 38–126)
Anion gap: 12 (ref 5–15)
BUN: 14 mg/dL (ref 6–20)
CO2: 27 mmol/L (ref 22–32)
Calcium: 9.9 mg/dL (ref 8.9–10.3)
Chloride: 100 mmol/L (ref 98–111)
Creatinine, Ser: 0.9 mg/dL (ref 0.61–1.24)
GFR, Estimated: >60 mL/min (ref >60)
Glucose, Bld: 113 mg/dL — ABNORMAL HIGH (ref 70–99)
Potassium: 4 mmol/L (ref 3.5–5.1)
Sodium: 139 mmol/L (ref 135–145)
Total Bilirubin: 0.5 mg/dL (ref 0.3–1.2)
Total Protein: 8.4 g/dL — ABNORMAL HIGH (ref 6.5–8.1)

## 2023-05-09 LAB — CBC WITH DIFFERENTIAL/PLATELET
Abs Immature Granulocytes: 0.04 10*3/uL (ref 0.00–0.07)
Basophils Absolute: 0 10*3/uL (ref 0.0–0.1)
Basophils Relative: 0 %
Eosinophils Absolute: 0.1 10*3/uL (ref 0.0–0.5)
Eosinophils Relative: 2 %
HCT: 45 % (ref 39.0–52.0)
Hemoglobin: 14.5 g/dL (ref 13.0–17.0)
Immature Granulocytes: 1 %
Lymphocytes Relative: 25 %
Lymphs Abs: 1.6 10*3/uL (ref 0.7–4.0)
MCH: 29 pg (ref 26.0–34.0)
MCHC: 32.2 g/dL (ref 30.0–36.0)
MCV: 90 fL (ref 80.0–100.0)
Monocytes Absolute: 0.4 10*3/uL (ref 0.1–1.0)
Monocytes Relative: 7 %
Neutro Abs: 4.3 10*3/uL (ref 1.7–7.7)
Neutrophils Relative %: 65 %
Platelets: 228 10*3/uL (ref 150–400)
RBC: 5 MIL/uL (ref 4.22–5.81)
RDW: 13.7 % (ref 11.5–15.5)
WBC: 6.5 10*3/uL (ref 4.0–10.5)
nRBC: 0 % (ref 0.0–0.2)

## 2023-05-09 LAB — CBG MONITORING, ED: Glucose-Capillary: 111 mg/dL — ABNORMAL HIGH (ref 70–99)

## 2023-05-09 LAB — TROPONIN I (HIGH SENSITIVITY): Troponin I (High Sensitivity): 3 ng/L (ref <18)

## 2023-05-09 LAB — MAGNESIUM: Magnesium: 2.2 mg/dL (ref 1.7–2.4)

## 2023-05-09 MED ORDER — LAMOTRIGINE 25 MG PO TABS
450.0000 mg | ORAL_TABLET | Freq: Once | ORAL | Status: AC
  Administered 2023-05-09: 450 mg via ORAL
  Filled 2023-05-09: qty 2

## 2023-05-09 MED ORDER — CLOBAZAM 5 MG PO HALF TABLET
10.0000 mg | ORAL_TABLET | Freq: Once | ORAL | Status: AC
  Administered 2023-05-09: 10 mg via ORAL
  Filled 2023-05-09: qty 2

## 2023-05-09 MED ORDER — LORAZEPAM 2 MG/ML IJ SOLN
INTRAMUSCULAR | Status: AC
  Filled 2023-05-09: qty 1

## 2023-05-09 MED ORDER — LORAZEPAM 2 MG/ML IJ SOLN
2.0000 mg | Freq: Once | INTRAMUSCULAR | Status: AC
  Administered 2023-05-09: 2 mg via INTRAVENOUS

## 2023-05-09 MED ORDER — MIDAZOLAM HCL (PF) 10 MG/2ML IJ SOLN
INTRAMUSCULAR | Status: DC
Start: 2023-05-09 — End: 2023-05-09
  Filled 2023-05-09: qty 2

## 2023-05-09 NOTE — ED Triage Notes (Signed)
Patient to Ed via POV from [facility] neurology for a seizure. Patient was there for normal visit when he had a witnessed seizure lasting approx 90 seconds. Patient was lowered to ground by staff but might have hit head on counter. Aox4 upon arrival in ED.

## 2023-05-09 NOTE — ED Provider Notes (Signed)
[facility] Provider Note    None    (approximate)   History   Seizures   HPI  Timothy Potts is a 42 y.o. male   Past medical history of TBI and seizure disorder on Lamictal and clonazepam who presents to the emergency department after his routine neurology visit this morning he experienced a seizure.  While in the emergency department he had another seizure.  Each 1 lasted less than 2 minutes.  Generalized tonic-clonic seizure.  He is here with his staff member from his living facility who states otherwise he has been in his regular state of health no acute traumatic injuries and no recent illnesses and he has been fully compliant with all of his medications except missing a single dose of his antiepileptic this morning prior to going to his neurology appointment.  He had a seizure in the emergency department which was witnessed and lasted less than 1 minute generalized tonic-clonic seizure with some postictal confusion afterwards.  Independent Historian contributed to assessment above: Staff member from his living facility is here with all of his paperwork and medication lists corroborates information given above  External Medical Documents Reviewed: Allergy note from earlier this morning documenting his seizure history and current medications as well as a consideration for starting lacosamide in the future      Physical Exam   Triage Vital Signs: ED Triage Vitals [05/09/23 1549]  Enc Vitals Group     BP 130/81     Pulse Rate (!) 115     Resp 18     Temp 98.8 F (37.1 C)     Temp Source Oral     SpO2 (!) 89 %     Weight      Height      Head Circumference      Peak Flow      Pain Score 0     Pain Loc      Pain Edu?      Excl. in [place]?     Most recent vital signs: Vitals:   05/09/23 1553 05/09/23 1820  BP:  (!) 152/86  Pulse:  (!) 107  Resp:  18  Temp:    SpO2: 93% 96%    General: Awake, no distress.  CV:  Good peripheral perfusion.   Resp:  Normal effort.  Abd:  No distention.  Other:  Awake alert confused moving all extremities no obvious signs of head trauma lungs clear abdomen soft nontender skin appears euvolemic warm well-perfused   ED Results / Procedures / Treatments   Labs (all labs ordered are listed, but only abnormal results are displayed) Labs Reviewed  COMPREHENSIVE METABOLIC PANEL - Abnormal; Notable for the following components:      Result Value   Glucose, Bld 113 (*)    Total Protein 8.4 (*)    All other components within normal limits  CBG MONITORING, ED - Abnormal; Notable for the following components:   Glucose-Capillary 111 (*)    All other components within normal limits  CBC WITH DIFFERENTIAL/PLATELET  MAGNESIUM  LAMOTRIGINE LEVEL  LEVETIRACETAM LEVEL  TROPONIN I (HIGH SENSITIVITY)     I ordered and reviewed the above labs they are notable for normal electrolytes and glucose  EKG  ED ECG REPORT I, Pilar Jarvis, the attending physician, personally viewed and interpreted this ECG.   Date: 05/09/2023  EKG Time: 1621  Rate: 106  Rhythm: sinus tachycardia  Axis: nl  Intervals:none  ST&T Change: no  stemi    RADIOLOGY I independently reviewed and interpreted CT head and see no bleeding    PROCEDURES:  Critical Care performed: No  Procedures   MEDICATIONS ORDERED IN ED: Medications  LORazepam (ATIVAN) 2 MG/ML injection (0 mg  Hold 05/09/23 1818)  LORazepam (ATIVAN) injection 2 mg (2 mg Intravenous Given 05/09/23 1807)  cloBAZam (ONFI) tablet 10 mg (10 mg Oral Given 05/09/23 1831)  lamoTRIgine (LAMICTAL) tablet 450 mg (450 mg Oral Given 05/09/23 1831)     IMPRESSION / MDM / ASSESSMENT AND PLAN / ED COURSE  I reviewed the triage vital signs and the nursing notes.                                Patient's presentation is most consistent with acute presentation with potential threat to life or bodily function.  Differential diagnosis includes, but is not limited to,  breakthrough seizure in the setting of missed medication this morning, electrolyte disturbance, intracranial bleeding, infx   The patient is on the cardiac monitor to evaluate for evidence of arrhythmia and/or significant heart rate changes.  MDM: Patient with seizure history on antiepileptics who did not take his seizure medication this morning and went to the doctor's appointment and had 2 seizures.  Back to baseline now.  Labs unremarkable CT imaging normal and he was initially slightly hypoxemic requiring 2 L nasal cannula, now normalized back to baseline mentation no oxygen requirement 94% on room air.  He was given his home medications as well as a dose of IV Ativan after his second seizure in the emergency department.  Antiepileptic levels sent and pending for which she will follow-up with his neurologist and return if there are any worsening or increase in seizure activity.         FINAL CLINICAL IMPRESSION(S) / ED DIAGNOSES   Final diagnoses:  Seizure (H[facility])     Rx / DC Orders   ED Discharge Orders     None        Note:  This document was prepared using Dragon voice recognition software and may include unintentional dictation errors.    Pilar Jarvis, MD 05/09/23 [phone]

## 2023-05-09 NOTE — Discharge Instructions (Signed)
Call your neurologist for a follow-up appointment.  Take all your antiseizure medications as prescribed.  Have your seizure doctor check your seizure medicine levels that we sent to the lab today.  If you have any new worsening or unexpected symptoms go back to the emergency department for recheck.

## 2023-05-09 NOTE — ED Provider Triage Note (Signed)
Emergency Medicine Provider Triage Evaluation Note  KHALEEL FINGERHUT , a 42 y.o. male  was evaluated in triage.  Pt complains of seizure.  Patient presents emergency department after having a seizure today while the neurologist.  Patient has a history of TBI, epilepsy.  Patient has not missed any doses of his Keppra or procedures.  Patient denies any other previous complaints and states that he could "feel a seizure coming."  Last seizure was in February..  Today when he had a seizure he was standing, did fall.  Patient was partially caught but did hit the ground and hit his head.  Review of Systems  Positive: Seizure, fall, minor head injury. Negative: Fever, chills, cough, chest pain, abdominal pain, urinary changes  Physical Exam  BP 130/81 (BP Location: Right Arm)   Pulse (!) 115   Temp 98.8 F (37.1 C) (Oral)   Resp 18   SpO2 (!) 89%  Gen:   Awake, no distress   Resp:  Normal effort  MSK:   Moves extremities without difficulty  Other:    Medical Decision Making  Medically screening exam initiated at 3:51 PM.  Appropriate orders placed.  [facility] OUTHOUSE was informed that the remainder of the evaluation will be completed by another provider, this initial triage assessment does not replace that evaluation, and the importance of remaining in the ED until their evaluation is complete.  Patient presents to the ED from [facility] clinic after having a seizure and falling.  History of TBI and epilepsy.  Patient seized, was partially caught but did fall and hit his head.  Patient will have labs, imaging was 89% patiently placed on O2 at this time.   Racheal Patches, PA-C 05/09/23 1553

## 2023-05-13 LAB — LAMOTRIGINE LEVEL: Lamotrigine Lvl: 8.5 ug/mL (ref 2.0–20.0)

## 2023-05-13 LAB — LEVETIRACETAM LEVEL: Levetiracetam Lvl: <2 ug/mL — ABNORMAL LOW (ref 10.0–40.0)

## 2024-07-29 ENCOUNTER — Emergency Department
Admission: EM | Admit: 2024-07-29 | Discharge: 2024-07-29 | Disposition: A | Discharge status: Home / Self Care | Payer: MEDICAID | Attending: Emergency Medicine | Admitting: Emergency Medicine

## 2024-07-29 ENCOUNTER — Other Ambulatory Visit: Payer: Self-pay

## 2024-07-29 DIAGNOSIS — R531 Weakness: Secondary | ICD-10-CM | POA: Diagnosis present

## 2024-07-29 DIAGNOSIS — E871 Hypo-osmolality and hyponatremia: Secondary | ICD-10-CM | POA: Diagnosis not present

## 2024-07-29 LAB — CBC WITH DIFFERENTIAL/PLATELET
Abs Immature Granulocytes: 0.04 K/uL (ref 0.00–0.07)
Basophils Absolute: 0 K/uL (ref 0.0–0.1)
Basophils Relative: 0 %
Eosinophils Absolute: 0.1 K/uL (ref 0.0–0.5)
Eosinophils Relative: 1 %
HCT: 37.3 % — ABNORMAL LOW (ref 39.0–52.0)
Hemoglobin: 12.7 g/dL — ABNORMAL LOW (ref 13.0–17.0)
Immature Granulocytes: 1 %
Lymphocytes Relative: 22 %
Lymphs Abs: 1.4 K/uL (ref 0.7–4.0)
MCH: 29.2 pg (ref 26.0–34.0)
MCHC: 34 g/dL (ref 30.0–36.0)
MCV: 85.7 fL (ref 80.0–100.0)
Monocytes Absolute: 0.3 K/uL (ref 0.1–1.0)
Monocytes Relative: 4 %
Neutro Abs: 4.6 K/uL (ref 1.7–7.7)
Neutrophils Relative %: 72 %
Platelets: 255 K/uL (ref 150–400)
RBC: 4.35 MIL/uL (ref 4.22–5.81)
RDW: 13.4 % (ref 11.5–15.5)
WBC: 6.4 K/uL (ref 4.0–10.5)
nRBC: 0 % (ref 0.0–0.2)

## 2024-07-29 LAB — COMPREHENSIVE METABOLIC PANEL WITH GFR
ALT: 13 U/L (ref 0–44)
AST: 17 U/L (ref 15–41)
Albumin: 4 g/dL (ref 3.5–5.0)
Alkaline Phosphatase: 74 U/L (ref 38–126)
Anion gap: 11 (ref 5–15)
BUN: 6 mg/dL (ref 6–20)
CO2: 24 mmol/L (ref 22–32)
Calcium: 9.4 mg/dL (ref 8.9–10.3)
Chloride: 98 mmol/L (ref 98–111)
Creatinine, Ser: 0.75 mg/dL (ref 0.61–1.24)
GFR, Estimated: >60 mL/min (ref >60)
Glucose, Bld: 129 mg/dL — ABNORMAL HIGH (ref 70–99)
Potassium: 3.7 mmol/L (ref 3.5–5.1)
Sodium: 133 mmol/L — ABNORMAL LOW (ref 135–145)
Total Bilirubin: 0.3 mg/dL (ref 0.0–1.2)
Total Protein: 7.1 g/dL (ref 6.5–8.1)

## 2024-07-29 NOTE — ED Notes (Signed)
 RN spoke with Alm Sharps (pt's legal guardian) and notified him of pt being discharged back to facility.

## 2024-07-29 NOTE — ED Notes (Signed)
 PT left with [facility] staff. Legal Guardian was notified by day shift RN.

## 2024-07-29 NOTE — Discharge Instructions (Addendum)
 Sodium improved to 133 on our testing.

## 2024-07-29 NOTE — ED Notes (Signed)
 Legal guardian is coming to pick up pt.

## 2024-07-29 NOTE — ED Triage Notes (Signed)
 Patient sent to ED for Na 125; reports headache and blurred vision in bilateral eyes.
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
## 2024-08-04 ENCOUNTER — Other Ambulatory Visit: Payer: Self-pay

## 2024-08-04 ENCOUNTER — Emergency Department
Admission: EM | Admit: 2024-08-04 | Discharge: 2024-08-04 | Disposition: A | Discharge status: Home / Self Care | Payer: MEDICAID | Source: Other Acute Inpatient Hospital | Attending: Emergency Medicine | Admitting: Emergency Medicine

## 2024-08-04 DIAGNOSIS — H532 Diplopia: Secondary | ICD-10-CM | POA: Diagnosis not present

## 2024-08-04 DIAGNOSIS — R4182 Altered mental status, unspecified: Secondary | ICD-10-CM | POA: Diagnosis not present

## 2024-08-04 DIAGNOSIS — E871 Hypo-osmolality and hyponatremia: Secondary | ICD-10-CM | POA: Diagnosis not present

## 2024-08-04 DIAGNOSIS — R42 Dizziness and giddiness: Secondary | ICD-10-CM | POA: Diagnosis present

## 2024-08-04 LAB — CBC
HCT: 40.3 % (ref 39.0–52.0)
Hemoglobin: 13.4 g/dL (ref 13.0–17.0)
MCH: 28.5 pg (ref 26.0–34.0)
MCHC: 33.3 g/dL (ref 30.0–36.0)
MCV: 85.7 fL (ref 80.0–100.0)
Platelets: 277 K/uL (ref 150–400)
RBC: 4.7 MIL/uL (ref 4.22–5.81)
RDW: 13.1 % (ref 11.5–15.5)
WBC: 8.2 K/uL (ref 4.0–10.5)
nRBC: 0 % (ref 0.0–0.2)

## 2024-08-04 LAB — COMPREHENSIVE METABOLIC PANEL WITH GFR
ALT: 15 U/L (ref 0–44)
AST: 18 U/L (ref 15–41)
Albumin: 4.5 g/dL (ref 3.5–5.0)
Alkaline Phosphatase: 80 U/L (ref 38–126)
Anion gap: 9 (ref 5–15)
BUN: 7 mg/dL (ref 6–20)
CO2: 26 mmol/L (ref 22–32)
Calcium: 9.7 mg/dL (ref 8.9–10.3)
Chloride: 93 mmol/L — ABNORMAL LOW (ref 98–111)
Creatinine, Ser: 0.75 mg/dL (ref 0.61–1.24)
GFR, Estimated: >60 mL/min (ref >60)
Glucose, Bld: 88 mg/dL (ref 70–99)
Potassium: 4.4 mmol/L (ref 3.5–5.1)
Sodium: 128 mmol/L — ABNORMAL LOW (ref 135–145)
Total Bilirubin: 0.8 mg/dL (ref 0.0–1.2)
Total Protein: 7.9 g/dL (ref 6.5–8.1)

## 2024-08-04 NOTE — ED Triage Notes (Signed)
 See First RN note.  Pt reports he spaced out.  Pt is a poor historian.  Hx of TBI and chronic double vision.
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
## 2024-08-04 NOTE — ED Triage Notes (Signed)
 First nurse note  Ems reports that they picked the pt up from [facility]. Staff reported that the pt was acting spaced out and not himself. Pt reports double vision his whole life but gets better after a nap, pt has a hx of TBI. New medication of metformin in the past month. Cbg of 81 149/89, 76, 97%

## 2024-08-11 NOTE — Progress Notes (Signed)
Patient arrived in clinic today for appointment with scheduled provider. Patient was called back to exam room for initial intake from nursing staff and identified via name and date of birth per protocol.     Nursing staff assessed height and weight and obtained accurate vital signs.      In active dialogue with patient reviewed chief complaint, completed a falls risk assessment for safety needs, reviewed allergies, did a complete medication review including updating the preferred pharmacy with the location and contact numbers.    The patient's pain was assessed with the corresponding pain scale noted in the pain flowsheet.  All questions and concerns addressed at this time regarding the patient's visit thus far.  Refill needs and personal or clinic paperwork was discussed.       Nursing staff provided the patient with their appropriate paperwork per providers request to complete for pending visit.

## 2024-09-01 ENCOUNTER — Emergency Department
Admission: EM | Admit: 2024-09-01 | Discharge: 2024-09-01 | Disposition: A | Discharge status: Home / Self Care | Payer: MEDICAID

## 2024-09-01 ENCOUNTER — Other Ambulatory Visit: Payer: Self-pay

## 2024-09-01 DIAGNOSIS — H538 Other visual disturbances: Secondary | ICD-10-CM | POA: Diagnosis not present

## 2024-09-01 DIAGNOSIS — R42 Dizziness and giddiness: Secondary | ICD-10-CM | POA: Diagnosis present

## 2024-09-01 LAB — CBC WITH DIFFERENTIAL/PLATELET
Abs Immature Granulocytes: 0.04 K/uL (ref 0.00–0.07)
Basophils Absolute: 0 K/uL (ref 0.0–0.1)
Basophils Relative: 0 %
Eosinophils Absolute: 0.1 K/uL (ref 0.0–0.5)
Eosinophils Relative: 1 %
HCT: 40.5 % (ref 39.0–52.0)
Hemoglobin: 13.3 g/dL (ref 13.0–17.0)
Immature Granulocytes: 1 %
Lymphocytes Relative: 23 %
Lymphs Abs: 1.4 K/uL (ref 0.7–4.0)
MCH: 29 pg (ref 26.0–34.0)
MCHC: 32.8 g/dL (ref 30.0–36.0)
MCV: 88.2 fL (ref 80.0–100.0)
Monocytes Absolute: 0.3 K/uL (ref 0.1–1.0)
Monocytes Relative: 6 %
Neutro Abs: 4.4 K/uL (ref 1.7–7.7)
Neutrophils Relative %: 69 %
Platelets: 251 K/uL (ref 150–400)
RBC: 4.59 MIL/uL (ref 4.22–5.81)
RDW: 13.5 % (ref 11.5–15.5)
WBC: 6.2 K/uL (ref 4.0–10.5)
nRBC: 0 % (ref 0.0–0.2)

## 2024-09-01 LAB — COMPREHENSIVE METABOLIC PANEL WITH GFR
ALT: 12 U/L (ref 0–44)
AST: 20 U/L (ref 15–41)
Albumin: 4.5 g/dL (ref 3.5–5.0)
Alkaline Phosphatase: 80 U/L (ref 38–126)
Anion gap: 11 (ref 5–15)
BUN: 7 mg/dL (ref 6–20)
CO2: 25 mmol/L (ref 22–32)
Calcium: 9.2 mg/dL (ref 8.9–10.3)
Chloride: 96 mmol/L — ABNORMAL LOW (ref 98–111)
Creatinine, Ser: 0.83 mg/dL (ref 0.61–1.24)
GFR, Estimated: >60 mL/min (ref >60)
Glucose, Bld: 84 mg/dL (ref 70–99)
Potassium: 4.3 mmol/L (ref 3.5–5.1)
Sodium: 132 mmol/L — ABNORMAL LOW (ref 135–145)
Total Bilirubin: 0.4 mg/dL (ref 0.0–1.2)
Total Protein: 7.6 g/dL (ref 6.5–8.1)

## 2024-09-01 LAB — URINALYSIS, ROUTINE W REFLEX MICROSCOPIC
Bilirubin Urine: NEGATIVE
Glucose, UA: NEGATIVE mg/dL
Hgb urine dipstick: NEGATIVE
Ketones, ur: NEGATIVE mg/dL
Leukocytes,Ua: NEGATIVE
Nitrite: NEGATIVE
Protein, ur: NEGATIVE mg/dL
Specific Gravity, Urine: 1.012 (ref 1.005–1.030)
pH: 7 (ref 5.0–8.0)

## 2024-09-01 MED ORDER — ACETAMINOPHEN 325 MG PO TABS
650.0000 mg | ORAL_TABLET | Freq: Once | ORAL | Status: AC
  Administered 2024-09-01: 650 mg via ORAL
  Filled 2024-09-01: qty 2

## 2024-09-01 NOTE — ED Provider Notes (Signed)
 [facility] Provider Note    Event Date/Time   First MD Initiated Contact with Patient 09/01/24 1010     (approximate)   History   Dizziness   HPI  Timothy Potts is a 43 y.o. male with a history of a TBI and mild mental delay with seizure disorder who presents to the emergency department with several days of lightheadedness and double vision.  Patient reports that he has had these symptoms multiple occasions and workup has been unremarkable.  He has had multiple imaging studies.  He reports taking his medications and has not had any seizures.  He is concerned that his sodium may be low.  Denies any falls, chest pain shortness of breath abdominal pain changes in urinary or bowel habits.  He reports that he is currently asymptomatic while lying in the bed      Physical Exam   Triage Vital Signs: ED Triage Vitals  Encounter Vitals Group     BP 09/01/24 1015 133/84     Girls Systolic BP Percentile --      Girls Diastolic BP Percentile --      Boys Systolic BP Percentile --      Boys Diastolic BP Percentile --      Pulse Rate 09/01/24 1015 79     Resp 09/01/24 1015 16     Temp 09/01/24 1015 98.2 F (36.8 C)     Temp Source 09/01/24 1015 Oral     SpO2 09/01/24 1015 96 %     Weight --      Height 09/01/24 1012 5' 5 (1.651 m)     Head Circumference --      Peak Flow --      Pain Score 09/01/24 1012 0     Pain Loc --      Pain Education --      Exclude from Growth Chart --     Most recent vital signs: Vitals:   09/01/24 1015  BP: 133/84  Pulse: 79  Resp: 16  Temp: 98.2 F (36.8 C)  SpO2: 96%    Nursing Triage Note reviewed. Vital signs reviewed and patients oxygen saturation is normoxic  General: Patient is well nourished, well developed, awake and alert, resting comfortably in no acute distress Head: Normocephalic and atraumatic Eyes: Normal inspection, extraocular muscles intact, no conjunctival pallor Ear, nose, throat: Normal  external exam Neck: Normal range of motion Respiratory: Patient is in no respiratory distress, lungs CTAB Cardiovascular: Patient is not tachycardic, RRR without murmur appreciated GI: Abd SNT with no guarding or rebound  Back: Normal inspection of the back with good strength and range of motion throughout all ext Extremities: pulses intact with good cap refills, no LE pitting edema or calf tenderness Neuro: The patient is alert and oriented to person, place, and time, appropriately conversive, with 5/5 bilat UE/LE strength, no gross motor or sensory defects noted. Coordination appears to be adequate.  Ambulates without ataxia Skin: Warm, dry, and intact Psych: normal mood and affect, no SI or HI  ED Results / Procedures / Treatments   Labs (all labs ordered are listed, but only abnormal results are displayed) Labs Reviewed  COMPREHENSIVE METABOLIC PANEL WITH GFR - Abnormal; Notable for the following components:      Result Value   Sodium 132 (*)    Chloride 96 (*)    All other components within normal limits  URINALYSIS, ROUTINE W REFLEX MICROSCOPIC - Abnormal; Notable for the following components:  Color, Urine YELLOW (*)    APPearance CLEAR (*)    All other components within normal limits  CBC WITH DIFFERENTIAL/PLATELET     EKG EKG and rhythm strip are interpreted by myself:   EKG: [Normal sinus rhythm] at heart rate of 75, normal QRS duration, QTc 404, nonspecific ST segments and T waves no ectopy EKG not consistent with Acute STEMI Rhythm strip: NSR in lead II   RADIOLOGY None    PROCEDURES:  Critical Care performed: No  Procedures   MEDICATIONS ORDERED IN ED: Medications  acetaminophen  (TYLENOL ) tablet 650 mg (650 mg Oral Given 09/01/24 1107)     IMPRESSION / MDM / ASSESSMENT AND PLAN / ED COURSE                                Differential diagnosis includes, but is not limited to: Electrolyte derangement anemia, UTI, arrhythmia   ED course: Patient  is well-appearing and has no focal neurological deficits on exam.  On chart review he has been here for similar complaints on several occasions over the past 2 months with unremarkable workups.  I do not think repeat imaging of the head is necessary today especially given lack of his deficits.  Electrolytes demonstrated no acute abnormalities.  He had no UTI or acute anemia.  He has ambulated throughout the unit without feeling lightheaded on multiple occasions.  He was reassured by the sodium level and feels comfortable returning home.    Clinical Course as of 09/01/24 1548  Tue Sep 01, 2024  1151 Sodium(!): 132 No acute abnormality [HD]  1158 Urinalysis, Routine w reflex microscopic -Urine, Clean Catch(!) Not consistent with your UTI [HD]  1159 Patient is ambulatory, eating and drinking appropriately.  Reviewed return precautions with him he voiced understanding were ready for discharge [HD]    Clinical Course User Index [HD] Nicholaus Rolland BRAVO, MD     FINAL CLINICAL IMPRESSION(S) / ED DIAGNOSES   Final diagnoses:  Lightheadedness  Blurry vision     Rx / DC Orders   ED Discharge Orders     None        Note:  This document was prepared using Dragon voice recognition software and may include unintentional dictation errors.   Nicholaus Rolland BRAVO, MD 09/01/24 [phone]

## 2024-09-01 NOTE — Discharge Instructions (Signed)
 You were seen in our emergency department for lightheadedness and blurry vision.  Your exam was reassuring.  Your blood work demonstrated a normal sodium level and your no other acute abnormalities found.  I think you are safe to return home and follow-up with your primary care physician.  Return if any acutely worsening symptoms or any other emergency. -- RETURN PRECAUTIONS & AFTERCARE: (ENGLISH) RETURN PRECAUTIONS: Return immediately to the emergency department or see/call your doctor if you feel worse, weak or have changes in speech or vision, are short of breath, have fever, vomiting, pain, bleeding or dark stool, trouble urinating or any new issues. Return here or see/call your doctor if not improving as expected for your suspected condition. FOLLOW-UP CARE: Call your doctor and/or any doctors we referred you to for more advice and to make an appointment. Do this today, tomorrow or after the weekend. Some doctors only take PPO insurance so if you have HMO insurance you may want to contact your HMO or your regular doctor for referral to a specialist within your plan. Either way tell the doctor's office that it was a referral from the emergency department so you get the soonest possible appointment.  YOUR TEST RESULTS: Take result reports of any blood or urine tests, imaging tests and EKG's to your doctor and any referral doctor. Have any abnormal tests repeated. Your doctor or a referral doctor can let you know when this should be done. Also make sure your doctor contacts this hospital to get any test results that are not currently available such as cultures or special tests for infection and final imaging reports, which are often not available at the time you leave the ER but which may list additional important findings that are not documented on the preliminary report. BLOOD PRESSURE: If your blood pressure was greater than 120/80 have your blood pressure rechecked within 1 to 2 weeks. MEDICATION SIDE  EFFECTS: Do not drive, walk, bike, take the bus, etc. if you have received or are being prescribed any sedating medications such as those for pain or anxiety or certain antihistamines like Benadryl. If you have been give one of these here get a taxi home or have a friend drive you home. Ask your pharmacist to counsel you on potential side effects of any new medication

## 2024-09-01 NOTE — ED Triage Notes (Addendum)
 Pt BIB [facility] from [facility]. Pt has had dizziness with standing for last couple days. Staff reported pt has mild-mod tremors. Pt currently denying pain and decreased dizziness with lying down. Pt experiencing some double vision which is not new to him. EMS VS WNL w/ negative orthostatics. CBG 125
